# Patient Record
Sex: Male | Born: 1980 | Race: Black or African American | Hispanic: No | Marital: Single | Smoking: Former smoker
Health system: Southern US, Community
[De-identification: ages and names within clinical notes are randomized; demographics above are authoritative.]

## PROBLEM LIST (undated history)

## (undated) DIAGNOSIS — I10 Essential (primary) hypertension: Secondary | ICD-10-CM

## (undated) DIAGNOSIS — K469 Unspecified abdominal hernia without obstruction or gangrene: Secondary | ICD-10-CM

## (undated) HISTORY — DX: Unspecified abdominal hernia without obstruction or gangrene: K46.9

---
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
## 2003-03-26 ENCOUNTER — Emergency Department (HOSPITAL_COMMUNITY): Admission: EM | Admit: 2003-03-26 | Discharge: 2003-03-26 | Payer: Self-pay | Admitting: Emergency Medicine

## 2003-07-01 ENCOUNTER — Emergency Department (HOSPITAL_COMMUNITY): Admission: EM | Admit: 2003-07-01 | Discharge: 2003-07-01 | Payer: Self-pay

## 2004-01-06 ENCOUNTER — Emergency Department (HOSPITAL_COMMUNITY): Admission: EM | Admit: 2004-01-06 | Discharge: 2004-01-06 | Payer: Self-pay | Admitting: Emergency Medicine

## 2004-07-03 ENCOUNTER — Emergency Department (HOSPITAL_COMMUNITY): Admission: EM | Admit: 2004-07-03 | Discharge: 2004-07-03 | Payer: Self-pay | Admitting: Emergency Medicine

## 2005-01-30 ENCOUNTER — Emergency Department (HOSPITAL_COMMUNITY): Admission: EM | Admit: 2005-01-30 | Discharge: 2005-01-30 | Payer: Self-pay | Admitting: Family Medicine

## 2007-03-06 ENCOUNTER — Emergency Department (HOSPITAL_COMMUNITY): Admission: EM | Admit: 2007-03-06 | Discharge: 2007-03-06 | Payer: Self-pay | Admitting: Emergency Medicine

## 2007-05-21 ENCOUNTER — Emergency Department (HOSPITAL_COMMUNITY): Admission: EM | Admit: 2007-05-21 | Discharge: 2007-05-21 | Payer: Self-pay | Admitting: Emergency Medicine

## 2007-07-26 ENCOUNTER — Emergency Department (HOSPITAL_COMMUNITY): Admission: EM | Admit: 2007-07-26 | Discharge: 2007-07-26 | Payer: Self-pay | Admitting: Family Medicine

## 2010-04-23 ENCOUNTER — Emergency Department (HOSPITAL_COMMUNITY)
Admission: EM | Admit: 2010-04-23 | Discharge: 2010-04-23 | Payer: Self-pay | Source: Home / Self Care | Admitting: Emergency Medicine

## 2010-10-13 ENCOUNTER — Inpatient Hospital Stay (INDEPENDENT_AMBULATORY_CARE_PROVIDER_SITE_OTHER)
Admission: RE | Admit: 2010-10-13 | Discharge: 2010-10-13 | Disposition: A | Discharge status: Home / Self Care | Payer: Self-pay | Source: Ambulatory Visit | Attending: Family Medicine | Admitting: Family Medicine

## 2010-10-13 DIAGNOSIS — K4090 Unilateral inguinal hernia, without obstruction or gangrene, not specified as recurrent: Secondary | ICD-10-CM

## 2010-10-29 ENCOUNTER — Ambulatory Visit (INDEPENDENT_AMBULATORY_CARE_PROVIDER_SITE_OTHER): Payer: Self-pay | Admitting: Surgery

## 2010-10-29 ENCOUNTER — Encounter (INDEPENDENT_AMBULATORY_CARE_PROVIDER_SITE_OTHER): Payer: Self-pay | Admitting: Surgery

## 2010-10-29 DIAGNOSIS — K4090 Unilateral inguinal hernia, without obstruction or gangrene, not specified as recurrent: Secondary | ICD-10-CM

## 2010-10-29 NOTE — Patient Instructions (Signed)
You will be scheduled for repair of your right inguinal hernia.  Limit heavy lifting to less than 20 lbs. If possible.

## 2010-10-29 NOTE — Progress Notes (Signed)
Subjective:     Patient ID: Connor Holloway, male   DOB: July 26, 1980, 30 y.o.   MRN: [id number]    BP 132/84  Pulse 50  Temp 96.8 F (36 C)  Ht 6' (1.829 m)  Wt 168 lb 6.4 oz (76.386 kg)  BMI 22.84 kg/m2    HPI  The patient is here today do to a bulge in his right groin. It has been present for 2 months. It is causing mild to moderate discomfort. Lifting makes a bulge in his right groin larger and causes more pain. The pain is a dull burning pain. It is a 5/10. Is made better with rest. The patient is here today at the request of   Review of Systems  Constitutional: Negative.   HENT: Negative.   Eyes: Negative.   Respiratory: Negative.   Cardiovascular: Negative.   Gastrointestinal: Negative.   Genitourinary: Negative.   Musculoskeletal: Negative.   Neurological: Negative.   Hematological: Negative.   Psychiatric/Behavioral: Negative.        Objective:   Physical Exam  Constitutional: He appears well-developed and well-nourished.  HENT:  Head: Normocephalic and atraumatic.  Eyes: EOM are normal. Pupils are equal, round, and reactive to light.  Neck: Normal range of motion. Neck supple.  Cardiovascular: Normal rate, regular rhythm, normal heart sounds and intact distal pulses.  Exam reveals no gallop and no friction rub.   No murmur heard. Pulmonary/Chest: Effort normal and breath sounds normal.  Abdominal: Soft. Bowel sounds are normal.       RIH reducible.  Soft non tender.No LIH.       Assessment:   Right inguinal hernia reducible Plan:  The patient has a symptomatic right inguinal hernia. I have discussed options of repair with him today. I've discussed nonoperative options of treatment. I reviewed both laparoscopic and open right inguinal hernia repair.The risk of hernia repair include bleeding,  Infection,   Recurrence of the hernia,  Mesh use, chronic pain,  Organ injury,  Bowel injury,  Bladder injury,   nerve injury with numbness around the incision,  and  worsening of preexisting  medical problems.  The alternatives to surgery have been discussed as well..  Long term expectations of both operative and non operative treatments have been discussed.   The patient agrees to proceed.

## 2010-11-18 ENCOUNTER — Emergency Department (HOSPITAL_COMMUNITY)
Admission: EM | Admit: 2010-11-18 | Discharge: 2010-11-18 | Disposition: A | Discharge status: Home / Self Care | Payer: Self-pay | Attending: Emergency Medicine | Admitting: Emergency Medicine

## 2010-11-18 DIAGNOSIS — I10 Essential (primary) hypertension: Secondary | ICD-10-CM | POA: Insufficient documentation

## 2010-11-18 DIAGNOSIS — K4090 Unilateral inguinal hernia, without obstruction or gangrene, not specified as recurrent: Secondary | ICD-10-CM | POA: Insufficient documentation

## 2011-01-18 ENCOUNTER — Ambulatory Visit (INDEPENDENT_AMBULATORY_CARE_PROVIDER_SITE_OTHER): Payer: PRIVATE HEALTH INSURANCE | Admitting: Internal Medicine

## 2011-01-18 ENCOUNTER — Encounter: Payer: Self-pay | Admitting: Internal Medicine

## 2011-01-18 VITALS — BP 122/80 | HR 74 | Temp 98.1°F | Resp 16 | Ht 72.0 in | Wt 178.0 lb

## 2011-01-18 DIAGNOSIS — K4090 Unilateral inguinal hernia, without obstruction or gangrene, not specified as recurrent: Secondary | ICD-10-CM

## 2011-01-18 DIAGNOSIS — Z0000 Encounter for general adult medical examination without abnormal findings: Secondary | ICD-10-CM

## 2011-01-18 NOTE — Patient Instructions (Signed)
General surgical evaluation  Return here as needed

## 2011-01-18 NOTE — Progress Notes (Signed)
Subjective:    Patient ID: Connor Holloway, male    DOB: 22-Jun-1980, 30 y.o.   MRN: [id number]  HPI   30 year old  -year-old patient of who presents today to establish with our practice. He has had a symptomatic right inguinal hernia since the spring of this year. He has been out of work for  approximately one month. He was seen by local general surgery but apparently they do not take his insurance. He is unable to work due to his symptomatic hernia. His past medical history is unremarkable he is an occasional same day smoker but never buys his own tobacco products. Social history unremarkable he is single employed at [employer].  Rare smoker graduate of  [employer]  Family history noncontributory father is late 70s in good health mother is 17 he has 4 sisters and 2 brothers all apparently in good health    Review of Systems  Constitutional: Negative for fever, chills, activity change, appetite change and fatigue.  HENT: Negative for hearing loss, ear pain, congestion, rhinorrhea, sneezing, mouth sores, trouble swallowing, neck pain, neck stiffness, dental problem, voice change, sinus pressure and tinnitus.   Eyes: Negative for photophobia, pain, redness and visual disturbance.  Respiratory: Negative for apnea, cough, choking, chest tightness, shortness of breath and wheezing.   Cardiovascular: Negative for chest pain, palpitations and leg swelling.  Gastrointestinal: Negative for nausea, vomiting, abdominal pain, diarrhea, constipation, blood in stool, abdominal distention, anal bleeding and rectal pain.       Symptomatic right inguinal hernia aggravated by heavy lifting  Genitourinary: Negative for dysuria, urgency, frequency, hematuria, flank pain, decreased urine volume, discharge, penile swelling, scrotal swelling, difficulty urinating, genital sores and testicular pain.  Musculoskeletal: Negative for myalgias, back pain, joint swelling, arthralgias and gait problem.    Skin: Negative for color change, rash and wound.  Neurological: Negative for dizziness, tremors, seizures, syncope, facial asymmetry, speech difficulty, weakness, light-headedness, numbness and headaches.  Hematological: Negative for adenopathy. Does not bruise/bleed easily.  Psychiatric/Behavioral: Negative for suicidal ideas, hallucinations, behavioral problems, confusion, sleep disturbance, self-injury, dysphoric mood, decreased concentration and agitation. The patient is not nervous/anxious.        Objective:   Physical Exam  Constitutional: He appears well-developed and well-nourished.  HENT:  Head: Normocephalic and atraumatic.  Right Ear: External ear normal.  Left Ear: External ear normal.  Nose: Nose normal.  Mouth/Throat: Oropharynx is clear and moist.  Eyes: Conjunctivae and EOM are normal. Pupils are equal, round, and reactive to light. No scleral icterus.  Neck: Normal range of motion. Neck supple. No JVD present. No thyromegaly present.  Cardiovascular: Regular rhythm, normal heart sounds and intact distal pulses.  Exam reveals no gallop and no friction rub.   No murmur heard. Pulmonary/Chest: Effort normal and breath sounds normal. He exhibits no tenderness.  Abdominal: Soft. Bowel sounds are normal. He exhibits no distension and no mass. There is no tenderness.       Easily reducible moderate-sized right inguinal hernia  Genitourinary: Penis normal.       Uncircumcised  Musculoskeletal: Normal range of motion. He exhibits no edema and no tenderness.  Lymphadenopathy:    He has no cervical adenopathy.  Neurological: He is alert. He has normal reflexes. No cranial nerve deficit. Coordination normal.  Skin: Skin is warm and dry. No rash noted.  Psychiatric: He has a normal mood and affect. His behavior is normal.          Assessment & Plan:  Preventive health Symptomatic right angle hernia. We'll set up for general surgery evaluation with a practice that  accepts his insurance plan

## 2011-01-21 ENCOUNTER — Telehealth (INDEPENDENT_AMBULATORY_CARE_PROVIDER_SITE_OTHER): Payer: Self-pay | Admitting: Surgery

## 2011-02-08 LAB — CULTURE, ROUTINE-ABSCESS: Gram Stain: NONE SEEN

## 2011-03-08 ENCOUNTER — Telehealth: Payer: Self-pay | Admitting: Internal Medicine

## 2011-03-08 NOTE — Telephone Encounter (Signed)
Pt need work note with no restrictions to take to employment office. Please call pt when ready for pick up

## 2011-03-09 NOTE — Telephone Encounter (Signed)
Pt aware letter ready for pick up 

## 2011-03-09 NOTE — Telephone Encounter (Signed)
OK 

## 2013-05-21 ENCOUNTER — Encounter (HOSPITAL_COMMUNITY): Payer: Self-pay | Admitting: Emergency Medicine

## 2013-05-21 ENCOUNTER — Emergency Department (HOSPITAL_COMMUNITY): Payer: Self-pay

## 2013-05-21 ENCOUNTER — Emergency Department (INDEPENDENT_AMBULATORY_CARE_PROVIDER_SITE_OTHER)
Admission: EM | Admit: 2013-05-21 | Discharge: 2013-05-21 | Disposition: A | Discharge status: Home / Self Care | Payer: Self-pay | Source: Home / Self Care | Attending: Family Medicine | Admitting: Family Medicine

## 2013-05-21 ENCOUNTER — Inpatient Hospital Stay (HOSPITAL_COMMUNITY)
Admission: EM | Admit: 2013-05-21 | Discharge: 2013-05-24 | DRG: 348 | Disposition: A | Discharge status: Home / Self Care | Payer: Self-pay | Attending: Surgery | Admitting: Surgery

## 2013-05-21 DIAGNOSIS — K4030 Unilateral inguinal hernia, with obstruction, without gangrene, not specified as recurrent: Principal | ICD-10-CM | POA: Diagnosis present

## 2013-05-21 DIAGNOSIS — K4090 Unilateral inguinal hernia, without obstruction or gangrene, not specified as recurrent: Secondary | ICD-10-CM | POA: Diagnosis present

## 2013-05-21 DIAGNOSIS — L0291 Cutaneous abscess, unspecified: Secondary | ICD-10-CM

## 2013-05-21 DIAGNOSIS — Z23 Encounter for immunization: Secondary | ICD-10-CM

## 2013-05-21 DIAGNOSIS — A4902 Methicillin resistant Staphylococcus aureus infection, unspecified site: Secondary | ICD-10-CM | POA: Diagnosis present

## 2013-05-21 DIAGNOSIS — L0390 Cellulitis, unspecified: Secondary | ICD-10-CM

## 2013-05-21 DIAGNOSIS — K612 Anorectal abscess: Secondary | ICD-10-CM | POA: Diagnosis present

## 2013-05-21 DIAGNOSIS — F17200 Nicotine dependence, unspecified, uncomplicated: Secondary | ICD-10-CM | POA: Diagnosis present

## 2013-05-21 DIAGNOSIS — K611 Rectal abscess: Secondary | ICD-10-CM | POA: Diagnosis present

## 2013-05-21 LAB — BASIC METABOLIC PANEL
BUN: 15 mg/dL (ref 6–23)
CALCIUM: 10.4 mg/dL (ref 8.4–10.5)
CHLORIDE: 98 meq/L (ref 96–112)
CO2: 23 mEq/L (ref 19–32)
CREATININE: 1.13 mg/dL (ref 0.50–1.35)
GFR calc non Af Amer: 86 mL/min — ABNORMAL LOW (ref >90)
Glucose, Bld: 103 mg/dL — ABNORMAL HIGH (ref 70–99)
POTASSIUM: 4.3 meq/L (ref 3.7–5.3)
SODIUM: 138 meq/L (ref 137–147)

## 2013-05-21 LAB — CBC
HCT: 49.3 % (ref 39.0–52.0)
HEMOGLOBIN: 17.5 g/dL — AB (ref 13.0–17.0)
MCH: 32.7 pg (ref 26.0–34.0)
MCHC: 35.5 g/dL (ref 30.0–36.0)
MCV: 92.1 fL (ref 78.0–100.0)
PLATELETS: 165 10*3/uL (ref 150–400)
RBC: 5.35 MIL/uL (ref 4.22–5.81)
RDW: 13.1 % (ref 11.5–15.5)
WBC: 15.3 10*3/uL — ABNORMAL HIGH (ref 4.0–10.5)

## 2013-05-21 MED ORDER — MORPHINE SULFATE 4 MG/ML IJ SOLN
6.0000 mg | Freq: Once | INTRAMUSCULAR | Status: AC
  Administered 2013-05-21: 6 mg via INTRAVENOUS
  Filled 2013-05-21: qty 2

## 2013-05-21 MED ORDER — MORPHINE SULFATE 2 MG/ML IJ SOLN
2.0000 mg | INTRAMUSCULAR | Status: DC | PRN
  Administered 2013-05-21: 2 mg via INTRAVENOUS
  Filled 2013-05-21: qty 1

## 2013-05-21 MED ORDER — HYDROCODONE-ACETAMINOPHEN 10-325 MG PO TABS
0.5000 | ORAL_TABLET | ORAL | Status: DC | PRN
  Administered 2013-05-22 – 2013-05-23 (×5): 2 via ORAL
  Filled 2013-05-21 (×6): qty 2

## 2013-05-21 MED ORDER — HEPARIN SODIUM (PORCINE) 5000 UNIT/ML IJ SOLN
5000.0000 [IU] | Freq: Three times a day (TID) | INTRAMUSCULAR | Status: DC
  Administered 2013-05-21 – 2013-05-24 (×7): 5000 [IU] via SUBCUTANEOUS
  Filled 2013-05-21 (×12): qty 1

## 2013-05-21 MED ORDER — ONDANSETRON HCL 4 MG/2ML IJ SOLN: 4.0000 mg | Freq: Four times a day (QID) | INTRAMUSCULAR | Status: DC | PRN

## 2013-05-21 MED ORDER — SODIUM CHLORIDE 0.45 % IV SOLN
INTRAVENOUS | Status: DC
  Administered 2013-05-21 – 2013-05-22 (×2): via INTRAVENOUS

## 2013-05-21 MED ORDER — HYDROCODONE-ACETAMINOPHEN 5-325 MG PO TABS: 1.0000 | ORAL_TABLET | Freq: Four times a day (QID) | ORAL | Status: DC | PRN

## 2013-05-21 MED ORDER — DOCUSATE SODIUM 100 MG PO CAPS
100.0000 mg | ORAL_CAPSULE | Freq: Two times a day (BID) | ORAL | Status: DC
  Administered 2013-05-21 – 2013-05-23 (×4): 100 mg via ORAL
  Filled 2013-05-21 (×4): qty 1

## 2013-05-21 NOTE — ED Notes (Signed)
Surgery at bedside.

## 2013-05-21 NOTE — ED Notes (Signed)
Patient transported to Ultrasound 

## 2013-05-21 NOTE — ED Provider Notes (Signed)
Connor Holloway is a 33 y.o. male who presents to Urgent Care today for  1) right inguinal hernia. This is been present since 2012. It has been reducible up until recently. Currently it is not reducible and becoming more painful. He is having problems urinating because of the hernia. He was seen by general surgery back in 2012 but does not have health insurance he was unable to have surgery.  The pain is worse with straining and activity.   2) additionally patient has an abscess on his right buttock for the past 5 days. He's tried that back then warm compress which has not helped much. The pain is moderate. No other medications tried   Past Medical History  Diagnosis Date  . Hernia    History  Substance Use Topics  . Smoking status: Current Some Day Smoker    Types: Cigarettes  . Smokeless tobacco: Never Used  . Alcohol Use: 0.6 oz/week    1 Cans of beer per week   ROS as above Medications: No current facility-administered medications for this encounter.   No current outpatient prescriptions on file.    Exam:  BP 134/94  Pulse 128  Temp(Src) 97.7 F (36.5 C) (Oral)  Resp 16  SpO2 100% Gen: Well NAD HEENT: MMM Lungs: Normal work of breathing. CTABL Heart: RRR no MRG Abd: NABS, Soft. NT, ND Exts: Brisk capillary refill, warm and well perfused.  Skin: Small areas of fluctuance right buttocks due to gluteal cleft with surrounding induration and erythema. Tender to touch.  Groin: Large right scrotal mass. It appears to be fluctuant and nonreducible. He hernia versus hydrocele. Tender to touch.   INCISION AND DRAINAGE Performed by: Connor Holloway, Connor Holloway, S Consent: Verbal consent obtained. Risks and benefits: risks, benefits and alternatives were discussed Type: abscess  Body area: right buttock  Anesthesia: local infiltration  Incision was made with a scalpel.  Local anesthetic: lidocaine 2% with epinephrine  Anesthetic total: 4 ml  Complexity: simple Blunt dissection to  break up loculations  Drainage: purulent  Drainage amount: 1ml  Packing material: None  Patient tolerance: Patient tolerated the procedure well with no immediate complications.  Culture obtained    Assessment and Plan: 33 y.o. male with  1) right inguinal hernia. This is now painful and interfering with urination and her reducible. Plan to transfer to the emergency for evaluation and management.   2) abscess: Drained in the clinic today. We'll defer to antibiotic management with ER physician.  Would place patient on Septra because he has surrounding induration and may have cellulitis as well as abscess.   Discussed warning signs or symptoms. Please see discharge instructions. Patient expresses understanding.    Connor BongEvan S Khamille Beynon, MD 05/21/13 1023

## 2013-05-21 NOTE — ED Provider Notes (Signed)
CSN: [id number]     Arrival date & time 05/21/13  1028 History   First MD Initiated Contact with Patient 05/21/13 1146     Chief Complaint  Patient presents with  . Groin Pain    HPI  Patient presents with concerns of persistent/worsening pain in his right inguinal area. Pain has been present for greater than 1 year, the worse over the past week. There is associated scrotal edema, no penis pain, no dysuria, no hematuria, no fever, no chills, no vomiting, no diarrhea. Patient was actually seen in our affiliated urgent care Center for a cutaneous abscess that was treated.  He was subsequently referred here for further evaluation and management of his abdominal pain. He notes over the time his pain is become worse in spite of using OTC medication. The pain was initially improved with reduction of a hernia.  Over the past year the hernia has become irreducible.   Past Medical History  Diagnosis Date  . Hernia    History reviewed. No pertinent past surgical history. Family History  Problem Relation Age of Onset  . Hypertension Mother    History  Substance Use Topics  . Smoking status: Current Some Day Smoker    Types: Cigarettes  . Smokeless tobacco: Never Used  . Alcohol Use: 0.6 oz/week    1 Cans of beer per week    Review of Systems  Constitutional:       Per HPI, otherwise negative  HENT:       Per HPI, otherwise negative  Respiratory:       Per HPI, otherwise negative  Cardiovascular:       Per HPI, otherwise negative  Gastrointestinal: Negative for vomiting.  Endocrine:       Negative aside from HPI  Genitourinary:       Neg aside from HPI   Musculoskeletal:       Per HPI, otherwise negative  Skin: Negative.   Neurological: Negative for syncope.    Allergies  Review of patient's allergies indicates no known allergies.  Home Medications  No current outpatient prescriptions on file. BP 115/82  Pulse 95  Temp(Src) 97.8 F (36.6 C)  Resp 16  SpO2  98% Physical Exam  Nursing note and vitals reviewed. Constitutional: He is oriented to person, place, and time. He appears well-developed. No distress.  HENT:  Head: Normocephalic and atraumatic.  Eyes: Conjunctivae and EOM are normal.  Cardiovascular: Normal rate and regular rhythm.   Pulmonary/Chest: Effort normal. No stridor. No respiratory distress.  Abdominal: He exhibits no distension. There is tenderness in the right lower quadrant.    Genitourinary:     Musculoskeletal: He exhibits no edema.  Neurological: He is alert and oriented to person, place, and time.  Skin: Skin is warm and dry.  Psychiatric: He has a normal mood and affect.    ED Course  Procedures (including critical care time) Labs Review Labs Reviewed - No data to display Imaging Review US Scrotum  05/21/2013   CLINICAL DATA:  Groin pain.  History of hernia.  EXAM: SCROTAL ULTRASOUND  DOPPLER ULTRASOUND OF THE TESTICLES  TECHNIQUE: Complete ultrasound examination of the testicles, epididymis, and other scrotal structures was performed. Color and spectral Doppler ultrasound were also utilized to evaluate blood flow to the testicles.  COMPARISON:  None.  FINDINGS: Right testicle  Measurements: 3.9 x 3.0 x 2.6 cm. No mass or microlithiasis visualized.  Left testicle  Measurements: 4.6 x 2.5 x 2.6 cm. No mass or microlithiasis  visualized.  Right epididymis:  Normal in size and appearance.  Left epididymis:  Normal in size and appearance.  Hydrocele:  None visualized.  Varicocele:  None visualized.  Pulsed Doppler interrogation of both testes demonstrates low resistance arterial and venous waveforms bilaterally.  Fat was seen in the superior aspect of the right scrotum, compatible with provided history of right-sided hernia. No bowel was identified extending into the scrotum.  IMPRESSION: 1. Unremarkable appearance of the testes without evidence of mass or torsion. 2. Fat in the superior aspect of the right scrotum,  compatible with provided history of hernia. No evidence of bowel herniation.   Electronically Signed   By: Sebastian AcheAllen  Grady   On: 05/21/2013 12:04   [place] Art/ven Flow Abd Pelv Doppler  05/21/2013   CLINICAL DATA:  Groin pain.  History of hernia.  EXAM: SCROTAL ULTRASOUND  DOPPLER ULTRASOUND OF THE TESTICLES  TECHNIQUE: Complete ultrasound examination of the testicles, epididymis, and other scrotal structures was performed. Color and spectral Doppler ultrasound were also utilized to evaluate blood flow to the testicles.  COMPARISON:  None.  FINDINGS: Right testicle  Measurements: 3.9 x 3.0 x 2.6 cm. No mass or microlithiasis visualized.  Left testicle  Measurements: 4.6 x 2.5 x 2.6 cm. No mass or microlithiasis visualized.  Right epididymis:  Normal in size and appearance.  Left epididymis:  Normal in size and appearance.  Hydrocele:  None visualized.  Varicocele:  None visualized.  Pulsed Doppler interrogation of both testes demonstrates low resistance arterial and venous waveforms bilaterally.  Fat was seen in the superior aspect of the right scrotum, compatible with provided history of right-sided hernia. No bowel was identified extending into the scrotum.  IMPRESSION: 1. Unremarkable appearance of the testes without evidence of mass or torsion. 2. Fat in the superior aspect of the right scrotum, compatible with provided history of hernia. No evidence of bowel herniation.   Electronically Signed   By: Sebastian AcheAllen  Grady   On: 05/21/2013 12:04    EKG Interpretation   None          MDM   1. Inguinal hernia with irreducibility     Patient presents with an irreducible hernia, tachycardia, pain, I discussed this case with our surgical team who will assist with evaluation and disposition.   Gerhard Munchobert Elise Gladden, MD 05/22/13 [phone]

## 2013-05-21 NOTE — ED Notes (Signed)
Called ultrasound and confirmed patient is currently there and will be transported to POD A6 when completed.

## 2013-05-21 NOTE — H&P (Signed)
Connor Holloway is an 34 y.o. male.   Chief Complaint: RIH HPI: Connor Holloway presents to the ED with increased right groin pain stemming from a previously diagnosed RIH. He noted that it became irreducible sometime about a year ago and the pain increased though was still manageable. About a week ago he developed a right buttock abscess and about the same time his groin pain increased dramatically and has been building ever since. He had 1 e/o nausea last night but no emesis. Appetite is down. Denies problems with bowels but says it seems the hernia is making it difficult for him to start urinating.  Past Medical History  Diagnosis Date  . Hernia     History reviewed. No pertinent past surgical history.  Family History  Problem Relation Age of Onset  . Hypertension Mother    Social History:  reports that he has been smoking Cigarettes.  He has been smoking about 0.00 packs per day. He has never used smokeless tobacco. He reports that he drinks about 0.6 ounces of alcohol per week. He reports that he uses illicit drugs.  Allergies: No Known Allergies   Results for orders placed during the hospital encounter of 05/21/13 (from the past 48 hour(s))  CBC     Status: Abnormal   Collection Time    05/21/13  2:31 PM      Result Value Range   WBC 15.3 (*) 4.0 - 10.5 K/uL   RBC 5.35  4.22 - 5.81 MIL/uL   Hemoglobin 17.5 (*) 13.0 - 17.0 g/dL   HCT 96.0  45.4 - 09.8 %   MCV 92.1  78.0 - 100.0 fL   MCH 32.7  26.0 - 34.0 pg   MCHC 35.5  30.0 - 36.0 g/dL   RDW 11.9  14.7 - 82.9 %   Platelets 165  150 - 400 K/uL   US Scrotum  05/21/2013   CLINICAL DATA:  Groin pain.  History of hernia.  EXAM: SCROTAL ULTRASOUND  DOPPLER ULTRASOUND OF THE TESTICLES  TECHNIQUE: Complete ultrasound examination of the testicles, epididymis, and other scrotal structures was performed. Color and spectral Doppler ultrasound were also utilized to evaluate blood flow to the testicles.  COMPARISON:  None.  FINDINGS: Right  testicle  Measurements: 3.9 x 3.0 x 2.6 cm. No mass or microlithiasis visualized.  Left testicle  Measurements: 4.6 x 2.5 x 2.6 cm. No mass or microlithiasis visualized.  Right epididymis:  Normal in size and appearance.  Left epididymis:  Normal in size and appearance.  Hydrocele:  None visualized.  Varicocele:  None visualized.  Pulsed Doppler interrogation of both testes demonstrates low resistance arterial and venous waveforms bilaterally.  Fat was seen in the superior aspect of the right scrotum, compatible with provided history of right-sided hernia. No bowel was identified extending into the scrotum.  IMPRESSION: 1. Unremarkable appearance of the testes without evidence of mass or torsion. 2. Fat in the superior aspect of the right scrotum, compatible with provided history of hernia. No evidence of bowel herniation.   Electronically Signed   By: Sebastian Ache   On: 05/21/2013 12:04   [place] Art/ven Flow Abd Pelv Doppler  05/21/2013   CLINICAL DATA:  Groin pain.  History of hernia.  EXAM: SCROTAL ULTRASOUND  DOPPLER ULTRASOUND OF THE TESTICLES  TECHNIQUE: Complete ultrasound examination of the testicles, epididymis, and other scrotal structures was performed. Color and spectral Doppler ultrasound were also utilized to evaluate blood flow to the testicles.  COMPARISON:  None.  FINDINGS: Right testicle  Measurements: 3.9 x 3.0 x 2.6 cm. No mass or microlithiasis visualized.  Left testicle  Measurements: 4.6 x 2.5 x 2.6 cm. No mass or microlithiasis visualized.  Right epididymis:  Normal in size and appearance.  Left epididymis:  Normal in size and appearance.  Hydrocele:  None visualized.  Varicocele:  None visualized.  Pulsed Doppler interrogation of both testes demonstrates low resistance arterial and venous waveforms bilaterally.  Fat was seen in the superior aspect of the right scrotum, compatible with provided history of right-sided hernia. No bowel was identified extending into the scrotum.  IMPRESSION: 1.  Unremarkable appearance of the testes without evidence of mass or torsion. 2. Fat in the superior aspect of the right scrotum, compatible with provided history of hernia. No evidence of bowel herniation.   Electronically Signed   By: Sebastian AcheAllen  Grady   On: 05/21/2013 12:04    Review of Systems  Gastrointestinal: Positive for nausea and abdominal pain (Pelvic). Negative for vomiting, diarrhea and constipation.  Genitourinary: Positive for dysuria.  Skin:       Abscess right buttock  All other systems reviewed and are negative.    Blood pressure 135/80, pulse 76, temperature 97.8 F (36.6 C), resp. rate 16, SpO2 98.00%. Physical Exam  Constitutional: He is oriented to person, place, and time. He appears well-developed and well-nourished. No distress.  HENT:  Head: Normocephalic and atraumatic.  Nose: Nose normal.  Mouth/Throat: Oropharynx is clear and moist. No oropharyngeal exudate.  Eyes: Conjunctivae are normal. Right eye exhibits no discharge. Left eye exhibits no discharge. No scleral icterus.  Neck: Normal range of motion. Neck supple.  Cardiovascular: Normal rate, regular rhythm and normal heart sounds.  Exam reveals no gallop and no friction rub.   No murmur heard. Respiratory: Effort normal and breath sounds normal. No respiratory distress. He has no wheezes. He has no rales.  GI: Soft. Bowel sounds are normal. He exhibits no distension. There is no tenderness. There is no rebound and no guarding. A hernia is present. Hernia confirmed positive in the right inguinal area.  Genitourinary: Penis normal. Right testis shows mass and tenderness.  Musculoskeletal: Normal range of motion. He exhibits no edema and no tenderness.  Lymphadenopathy:    He has no cervical adenopathy.       Right: No inguinal adenopathy present.  Neurological: He is alert and oriented to person, place, and time.  Skin: Lesion (Right buttock) noted. He is not diaphoretic.  Psychiatric: He has a normal mood and  affect. His behavior is normal.     Assessment/Plan Incarcerated RIH -- Unable to reduce. [facility] Ad[facility] to [facility] with plans to repair tomorrow if able.    Freeman CaldronMichael J. Swathi Dauphin, PA-C Pager: [phone] 05/21/2013, 3:09 PM

## 2013-05-21 NOTE — ED Notes (Signed)
Pt was [facility] today for boil and was sent here for hernia that will not go back  In  Since 2012 has gotten bigger and he cannot reduce any more and it swells

## 2013-05-21 NOTE — H&P (Signed)
I have seen and examined the patient and agree with the assessment and plans. The patient has a right inguinal hernia which appears to have incarcerated omentum and not bowel. We'll plan to repair the hernia with mesh in the morning. I discussed the risks of surgery which includes but is not limited to bleeding, infection, chronic pain, nerve entrapment, injury to surrounding structures, use of mesh, postoperative recovery, recurrence, et Connor Holloway. He understands and wishes to proceed in the morning with  surgery  Connor Holloway A. Connor IvanBlackman  MD, FACS

## 2013-05-21 NOTE — ED Notes (Signed)
Clicked off Obtain Consent by mistake, patient still needs consent.

## 2013-05-21 NOTE — ED Notes (Signed)
C/o of boil on the right buttock x 5 days.  Having pain and swelling.  Denies drainage.  Pt has soaked and used fat back to bring to a head with no relief.    Also c/o having and inguinal hernia (right groin area) since 2012.  Pt is having pain and swelling/causing problems with urinating.

## 2013-05-22 ENCOUNTER — Encounter (HOSPITAL_COMMUNITY): Admission: EM | Disposition: A | Discharge status: Home / Self Care | Payer: Self-pay | Source: Home / Self Care

## 2013-05-22 ENCOUNTER — Observation Stay (HOSPITAL_COMMUNITY): Payer: Self-pay | Admitting: Anesthesiology

## 2013-05-22 ENCOUNTER — Encounter (HOSPITAL_COMMUNITY): Payer: Self-pay | Admitting: *Deleted

## 2013-05-22 ENCOUNTER — Encounter (HOSPITAL_COMMUNITY): Payer: Self-pay | Admitting: Anesthesiology

## 2013-05-22 DIAGNOSIS — L03317 Cellulitis of buttock: Secondary | ICD-10-CM

## 2013-05-22 DIAGNOSIS — K611 Rectal abscess: Secondary | ICD-10-CM | POA: Diagnosis present

## 2013-05-22 DIAGNOSIS — L0231 Cutaneous abscess of buttock: Secondary | ICD-10-CM

## 2013-05-22 HISTORY — PX: IRRIGATION AND DEBRIDEMENT ABSCESS: SHX5252

## 2013-05-22 HISTORY — PX: INGUINAL HERNIA REPAIR: SHX194

## 2013-05-22 LAB — SURGICAL PCR SCREEN
MRSA, PCR: POSITIVE — AB
Staphylococcus aureus: POSITIVE — AB

## 2013-05-22 SURGERY — REPAIR, HERNIA, INGUINAL, INCARCERATED
Anesthesia: General | Site: Groin | Laterality: Right

## 2013-05-22 MED ORDER — MUPIROCIN 2 % EX OINT
1.0000 "application " | TOPICAL_OINTMENT | Freq: Two times a day (BID) | CUTANEOUS | Status: DC
  Administered 2013-05-22 – 2013-05-23 (×3): 1 via NASAL
  Filled 2013-05-22: qty 22

## 2013-05-22 MED ORDER — PROPOFOL 10 MG/ML IV BOLUS
INTRAVENOUS | Status: AC
  Filled 2013-05-22: qty 20

## 2013-05-22 MED ORDER — INFLUENZA VAC SPLIT QUAD 0.5 ML IM SUSP
0.5000 mL | INTRAMUSCULAR | Status: AC
  Administered 2013-05-24: 0.5 mL via INTRAMUSCULAR
  Filled 2013-05-22: qty 0.5

## 2013-05-22 MED ORDER — METOCLOPRAMIDE HCL 5 MG/ML IJ SOLN
INTRAMUSCULAR | Status: DC | PRN
  Administered 2013-05-22: 10 mg via INTRAVENOUS

## 2013-05-22 MED ORDER — CHLORHEXIDINE GLUCONATE 0.12 % MT SOLN
15.0000 mL | Freq: Two times a day (BID) | OROMUCOSAL | Status: DC
  Administered 2013-05-22: 15 mL via OROMUCOSAL
  Filled 2013-05-22: qty 15

## 2013-05-22 MED ORDER — METOCLOPRAMIDE HCL 5 MG/ML IJ SOLN: 10.0000 mg | Freq: Once | INTRAMUSCULAR | Status: DC | PRN

## 2013-05-22 MED ORDER — MIDAZOLAM HCL 5 MG/5ML IJ SOLN
INTRAMUSCULAR | Status: DC | PRN
  Administered 2013-05-22: 2 mg via INTRAVENOUS

## 2013-05-22 MED ORDER — OXYCODONE HCL 5 MG PO TABS: 5.0000 mg | ORAL_TABLET | Freq: Once | ORAL | Status: DC | PRN

## 2013-05-22 MED ORDER — ONDANSETRON HCL 4 MG/2ML IJ SOLN
INTRAMUSCULAR | Status: DC | PRN
  Administered 2013-05-22: 4 mg via INTRAVENOUS

## 2013-05-22 MED ORDER — 0.9 % SODIUM CHLORIDE (POUR BTL) OPTIME
TOPICAL | Status: DC | PRN
  Administered 2013-05-22: 1000 mL

## 2013-05-22 MED ORDER — ONDANSETRON HCL 4 MG/2ML IJ SOLN
INTRAMUSCULAR | Status: AC
  Filled 2013-05-22: qty 2

## 2013-05-22 MED ORDER — KETOROLAC TROMETHAMINE 30 MG/ML IJ SOLN
INTRAMUSCULAR | Status: DC | PRN
  Administered 2013-05-22: 30 mg via INTRAVENOUS

## 2013-05-22 MED ORDER — PROPOFOL 10 MG/ML IV BOLUS
INTRAVENOUS | Status: DC | PRN
  Administered 2013-05-22: 200 mg via INTRAVENOUS

## 2013-05-22 MED ORDER — LIDOCAINE HCL (CARDIAC) 20 MG/ML IV SOLN
INTRAVENOUS | Status: DC | PRN
  Administered 2013-05-22: 100 mg via INTRAVENOUS

## 2013-05-22 MED ORDER — PIPERACILLIN-TAZOBACTAM 3.375 G IVPB 30 MIN
3.3750 g | Freq: Once | INTRAVENOUS | Status: DC
  Filled 2013-05-22: qty 50

## 2013-05-22 MED ORDER — HYDROMORPHONE HCL PF 1 MG/ML IJ SOLN
INTRAMUSCULAR | Status: AC
  Filled 2013-05-22: qty 1

## 2013-05-22 MED ORDER — ONDANSETRON HCL 4 MG/2ML IJ SOLN: 4.0000 mg | Freq: Four times a day (QID) | INTRAMUSCULAR | Status: DC | PRN

## 2013-05-22 MED ORDER — FENTANYL CITRATE 0.05 MG/ML IJ SOLN
INTRAMUSCULAR | Status: DC | PRN
  Administered 2013-05-22 (×3): 50 ug via INTRAVENOUS

## 2013-05-22 MED ORDER — MIDAZOLAM HCL 2 MG/2ML IJ SOLN
INTRAMUSCULAR | Status: AC
  Filled 2013-05-22: qty 2

## 2013-05-22 MED ORDER — OXYCODONE HCL 5 MG/5ML PO SOLN: 5.0000 mg | Freq: Once | ORAL | Status: DC | PRN

## 2013-05-22 MED ORDER — PIPERACILLIN-TAZOBACTAM 3.375 G IVPB
3.3750 g | Freq: Three times a day (TID) | INTRAVENOUS | Status: DC
  Administered 2013-05-22 – 2013-05-24 (×6): 3.375 g via INTRAVENOUS
  Filled 2013-05-22 (×8): qty 50

## 2013-05-22 MED ORDER — HYDROMORPHONE HCL PF 1 MG/ML IJ SOLN
0.2500 mg | INTRAMUSCULAR | Status: DC | PRN
  Administered 2013-05-22: 0.5 mg via INTRAVENOUS

## 2013-05-22 MED ORDER — BIOTENE DRY MOUTH MT LIQD
15.0000 mL | Freq: Two times a day (BID) | OROMUCOSAL | Status: DC
  Administered 2013-05-22: 15 mL via OROMUCOSAL

## 2013-05-22 MED ORDER — CHLORHEXIDINE GLUCONATE CLOTH 2 % EX PADS
6.0000 | MEDICATED_PAD | Freq: Every day | CUTANEOUS | Status: DC
  Administered 2013-05-22 – 2013-05-24 (×3): 6 via TOPICAL

## 2013-05-22 MED ORDER — BUPIVACAINE-EPINEPHRINE 0.5% -1:200000 IJ SOLN
INTRAMUSCULAR | Status: DC | PRN
  Administered 2013-05-22: 30 mL

## 2013-05-22 MED ORDER — KETOROLAC TROMETHAMINE 30 MG/ML IJ SOLN
INTRAMUSCULAR | Status: AC
  Filled 2013-05-22: qty 1

## 2013-05-22 MED ORDER — METOCLOPRAMIDE HCL 5 MG/ML IJ SOLN
INTRAMUSCULAR | Status: AC
  Filled 2013-05-22: qty 2

## 2013-05-22 MED ORDER — LACTATED RINGERS IV SOLN
INTRAVENOUS | Status: DC
  Administered 2013-05-22: 09:00:00 via INTRAVENOUS

## 2013-05-22 MED ORDER — FENTANYL CITRATE 0.05 MG/ML IJ SOLN
INTRAMUSCULAR | Status: AC
  Filled 2013-05-22: qty 5

## 2013-05-22 SURGICAL SUPPLY — 45 items
APL SKNCLS STERI-STRIP NONHPOA (GAUZE/BANDAGES/DRESSINGS) ×2
BENZOIN TINCTURE PRP APPL 2/3 (GAUZE/BANDAGES/DRESSINGS) ×4 IMPLANT
BLADE SURG 10 STRL SS (BLADE) ×4 IMPLANT
BLADE SURG 15 STRL LF DISP TIS (BLADE) ×2 IMPLANT
BLADE SURG 15 STRL SS (BLADE) ×4
BLADE SURG ROTATE 9660 (MISCELLANEOUS) ×2 IMPLANT
CHLORAPREP W/TINT 26ML (MISCELLANEOUS) ×4 IMPLANT
CLOSURE WOUND 1/2 X4 (GAUZE/BANDAGES/DRESSINGS) ×1
COVER SURGICAL LIGHT HANDLE (MISCELLANEOUS) ×4 IMPLANT
DRAIN PENROSE 1/2X12 LTX STRL (WOUND CARE) ×2 IMPLANT
DRAPE LAPAROTOMY TRNSV 102X78 (DRAPE) ×4 IMPLANT
DRESSING TELFA 8X3 (GAUZE/BANDAGES/DRESSINGS) ×4 IMPLANT
DRSG TEGADERM 4X4.75 (GAUZE/BANDAGES/DRESSINGS) ×4 IMPLANT
ELECT CAUTERY BLADE 6.4 (BLADE) ×4 IMPLANT
ELECT REM PT RETURN 9FT ADLT (ELECTROSURGICAL) ×4
ELECTRODE REM PT RTRN 9FT ADLT (ELECTROSURGICAL) ×2 IMPLANT
GLOVE BIO SURGEON STRL SZ7.5 (GLOVE) ×2 IMPLANT
GLOVE BIOGEL PI IND STRL 7.0 (GLOVE) IMPLANT
GLOVE BIOGEL PI INDICATOR 7.0 (GLOVE) ×2
GLOVE SURG SIGNA 7.5 PF LTX (GLOVE) ×4 IMPLANT
GLOVE SURG SS PI 7.0 STRL IVOR (GLOVE) ×2 IMPLANT
GOWN STRL NON-REIN LRG LVL3 (GOWN DISPOSABLE) ×4 IMPLANT
GOWN STRL REIN XL XLG (GOWN DISPOSABLE) ×4 IMPLANT
KIT BASIN OR (CUSTOM PROCEDURE TRAY) ×4 IMPLANT
KIT ROOM TURNOVER OR (KITS) ×4 IMPLANT
MESH PARIETEX PROGRIP RIGHT (Mesh General) ×2 IMPLANT
NDL HYPO 25GX1X1/2 BEV (NEEDLE) ×2 IMPLANT
NEEDLE HYPO 25GX1X1/2 BEV (NEEDLE) ×4 IMPLANT
NS IRRIG 1000ML POUR BTL (IV SOLUTION) ×4 IMPLANT
PACK SURGICAL SETUP 50X90 (CUSTOM PROCEDURE TRAY) ×4 IMPLANT
PAD ARMBOARD 7.5X6 YLW CONV (MISCELLANEOUS) ×8 IMPLANT
PENCIL BUTTON HOLSTER BLD 10FT (ELECTRODE) ×4 IMPLANT
SPECIMEN JAR SMALL (MISCELLANEOUS) IMPLANT
SPONGE GAUZE 4X4 12PLY (GAUZE/BANDAGES/DRESSINGS) ×4 IMPLANT
SPONGE LAP 18X18 X RAY DECT (DISPOSABLE) ×4 IMPLANT
STRIP CLOSURE SKIN 1/2X4 (GAUZE/BANDAGES/DRESSINGS) ×3 IMPLANT
SUT MON AB 4-0 PC3 18 (SUTURE) ×4 IMPLANT
SUT SILK 2 0 SH (SUTURE) ×2 IMPLANT
SUT VIC AB 2-0 CT1 27 (SUTURE) ×8
SUT VIC AB 2-0 CT1 TAPERPNT 27 (SUTURE) ×4 IMPLANT
SUT VIC AB 3-0 CT1 27 (SUTURE) ×4
SUT VIC AB 3-0 CT1 TAPERPNT 27 (SUTURE) ×2 IMPLANT
SYR CONTROL 10ML LL (SYRINGE) ×4 IMPLANT
TOWEL OR 17X24 6PK STRL BLUE (TOWEL DISPOSABLE) ×4 IMPLANT
TOWEL OR 17X26 10 PK STRL BLUE (TOWEL DISPOSABLE) ×4 IMPLANT

## 2013-05-22 NOTE — Interval H&P Note (Signed)
History and Physical Interval Note:  He also has a right buttock abscess that was not adequately drained in the ER yesterday so will also I and D the right buttock abscess.  Risks of this procedure were discussed also.  05/22/2013 9:08 AM  Connor Holloway  has presented today for surgery, with the diagnosis of Inc. Right Ing. Hernia  The various methods of treatment have been discussed with the patient and family. After consideration of risks, benefits and other options for treatment, the patient has consented to  Procedure(s): HERNIA REPAIR INGUINAL INCARCERATED WITH MESH (Right) as a surgical intervention .  The patient's history has been reviewed, patient examined, no change in status, stable for surgery.  I have reviewed the patient's chart and labs.  Questions were answered to the patient's satisfaction.     Bernd Crom A

## 2013-05-22 NOTE — Op Note (Signed)
HERNIA REPAIR INGUINAL INCARCERATED WITH MESH, IRRIGATION AND DEBRIDEMENT  BUTTOCK ABSCESS  Procedure Note  Orlan Leavensnthony T Verstraete 05/21/2013 - 05/22/2013   Pre-op Diagnosis: Inc. Right Ing. Hernia Buttock abcess     Post-op Diagnosis: same  Procedure(s): RIGHT HERNIA REPAIR INGUINAL INCARCERATED WITH MESH IRRIGATION AND DEBRIDEMENT  RIGHT BUTTOCK ABSCESS  Surgeon(s): Shelly Rubensteinouglas A Miriah Maruyama, MD  Anesthesia: General  Staff:  Circulator: Alpha GulaKathy Somers, RN Scrub Person: Leighton ParodyAnn Marie Wilson, CST Circulator Assistant: Lina SayreEmily E Ellis, RN RN First Assistant: Maureen RalphsSheila C Bowman, RN  Estimated Blood Loss: Minimal               Specimens:           Abigail MiyamotoBLACKMAN,Merl Bommarito A   Date: 05/22/2013  Time: 10:17 AM

## 2013-05-22 NOTE — Anesthesia Postprocedure Evaluation (Signed)
Anesthesia Post Note  Patient: Connor Holloway  Procedure(s) Performed: Procedure(s) (LRB): HERNIA REPAIR INGUINAL INCARCERATED WITH MESH (Right) IRRIGATION AND DEBRIDEMENT  BUTTOCK ABSCESS  Anesthesia type: General  Patient location: PACU  Post pain: Pain level controlled  Post assessment: Patient's Cardiovascular Status Stable  Last Vitals:  Filed Vitals:   05/22/13 1045  BP: 136/75  Pulse: 77  Temp:   Resp: 22    Post vital signs: Reviewed and stable  Level of consciousness: alert  Complications: No apparent anesthesia complications

## 2013-05-22 NOTE — Op Note (Signed)
[name], [name] NO.:  [id number]  MEDICAL RECORD NO.:  [id number]  LOCATION:  6N30C                        FACILITY:  [facility]  PHYSICIAN:  Abigail Miyamoto, M.D. DATE OF BIRTH:  11/09/1980  DATE OF PROCEDURE:  05/22/2013 DATE OF DISCHARGE:                              OPERATIVE REPORT   PREOPERATIVE DIAGNOSES: 1. Incarcerated right inguinal hernia. 2. Right buttock abscess.  POSTOPERATIVE DIAGNOSES: 1. Incarcerated right inguinal hernia. 2. Right buttock abscess.  PROCEDURE: 1. Repair of incarcerated right inguinal hernia with mesh. 2. Incision and drainage of right buttock abscess.  SURGEON:  Abigail Miyamoto, MD  ANESTHESIA:  General and 0.5% Marcaine.  ESTIMATED BLOOD LOSS:  Minimal.  PROCEDURE IN DETAIL:  The patient was brought to the operating room, identified as Connor Holloway.  He was placed supine on the operating table and general anesthesia was induced.  His abdomen was then prepped and draped in usual sterile fashion.  I performed a right ilioinguinal nerve block with Marcaine.  I anesthetized the skin with Marcaine as well in the right groin.  I then made a longitudinal incision with a scalpel.  I took this down through Scarpa fascia with electrocautery. The external oblique fascia then identified and opened through the internal and external rings.  The enlarged testicular cord structures were controlled with Penrose drain.  I was able to separate the thickened sac from the cord structures.  I opened the sac and pulled all of the omentum out of the scrotum and reduced it back into the abdominal cavity.  No bowel was involved in the hernia.  I then dissected the sac off completely.  I tied off the base with 2-0 silk suture and then excised the redundant sac.  Next, a piece of Parietex ProGrip Prolene mesh was brought into the field.  I placed it as an onlay on the inguinal floor and brought around the cord structures.  I sewed the  mesh to the pubic tubercle and the shelving edge of the inguinal ligament with interrupted 2-0 Vicryl sutures.  Good coverage of the inguinal floor appeared to be achieved.  I then removed the Penrose drain.  I closed the external oblique fascia over top with a running 2-0 Vicryl suture.  I anesthetized the fascia and skin further with Marcaine.  I then closed the Scarpa fascia with interrupted 3-0 Vicryl sutures and closed the skin with a running 4-0 Monocryl.  Steri-Strips, Telfa, and Tegaderm were then applied.  Next, the patient was frog-legged.  I prepped the area of the abscess which had had a previous incision with Betadine.  I then opened this up further after anesthetizing with Marcaine with a scalpel.  I identified a large abscess cavity which was drained.  I then packed this with wet-to-dry 4 x 4.  Dry gauze and tape were then placed over this.  The patient tolerated the procedure well.  All the counts were correct at the end of the procedure.  The patient was then extubated in the operating room and taken in stable condition to the recovery room.     Abigail Miyamoto, M.D.     DB/MEDQ  D:  05/22/2013  T:  05/22/2013  Job:  [id number]

## 2013-05-22 NOTE — Anesthesia Procedure Notes (Signed)
Procedure Name: LMA Insertion Date/Time: 05/22/2013 9:35 AM Performed by: Arlice ColtMANESS, Jacquelinne Speak B Pre-anesthesia Checklist: Patient identified, Emergency Drugs available, Suction available, Patient being monitored and Timeout performed Patient Re-evaluated:Patient Re-evaluated prior to inductionOxygen Delivery Method: Circle system utilized Preoxygenation: Pre-oxygenation with 100% oxygen Intubation Type: IV induction LMA: LMA inserted LMA Size: 4.0 Number of attempts: 1 Placement Confirmation: positive ETCO2 and breath sounds checked- equal and bilateral Tube secured with: Tape Dental Injury: Teeth and Oropharynx as per pre-operative assessment

## 2013-05-22 NOTE — Anesthesia Preprocedure Evaluation (Signed)
Anesthesia Evaluation  Patient identified by MRN, date of birth, ID band Patient awake    Reviewed: Allergy & Precautions, H&P , NPO status , Patient's Chart, lab work & pertinent test results, reviewed documented beta blocker date and time   Airway Mallampati: II TM Distance: >3 FB Neck ROM: full    Dental   Pulmonary Current Smoker,  breath sounds clear to auscultation        Cardiovascular negative cardio ROS  Rhythm:regular     Neuro/Psych negative neurological ROS  negative psych ROS   GI/Hepatic negative GI ROS, Neg liver ROS,   Endo/Other  negative endocrine ROS  Renal/GU negative Renal ROS  negative genitourinary   Musculoskeletal   Abdominal   Peds  Hematology negative hematology ROS (+)   Anesthesia Other Findings See surgeon's H&P   Reproductive/Obstetrics negative OB ROS                           Anesthesia Physical Anesthesia Plan  ASA: II  Anesthesia Plan: General   Post-op Pain Management:    Induction: Intravenous  Airway Management Planned: LMA  Additional Equipment:   Intra-op Plan:   Post-operative Plan:   Informed Consent: I have reviewed the patients History and Physical, chart, labs and discussed the procedure including the risks, benefits and alternatives for the proposed anesthesia with the patient or authorized representative who has indicated his/her understanding and acceptance.   Dental Advisory Given  Plan Discussed with: CRNA and Surgeon  Anesthesia Plan Comments:         Anesthesia Quick Evaluation  

## 2013-05-22 NOTE — Progress Notes (Signed)
Patient ID: Connor Holloway, male   DOB: 08-Nov-1980, 33 y.o.   MRN: [id number]  Subjective: Complains of buttock pain, drainage x1 week.  Denies fever or chills.   Objective:  Vital signs:  Filed Vitals:   05/21/13 1734 05/21/13 1800 05/21/13 2114 05/22/13 0642  BP: 134/82 139/76 136/81 127/72  Pulse: 76 75 103 81  Temp: 98.1 F (36.7 C)  98.5 F (36.9 C) 99.4 F (37.4 C)  TempSrc: Oral  Oral Oral  Resp: 20 16 18 17   Height:   6' 1"  (1.854 m)   Weight:   167 lb 12.8 oz (76.114 kg)   SpO2: 98% 98% 98% 98%    Last BM Date: 05/21/13  Intake/Output   Yesterday:  01/20 0701 - 01/21 0700 In: 600 [I.V.:600] Out: 300 [Urine:300] This shift:  Physical Exam:  General: Pt awake/alert/oriented x3 an in no acute distress Chest: CTA. No chest wall pain w good excursion CV:  Pulses intact.  Regular rhythm Abdomen: Soft.  Nondistended.  No evidence of peritonitis.  Right inguinal hernia noted.. Ext:  SCDs BLE.  No mjr edema.  No cyanosis Skin: No petechiae / purpura.  Approximately 4x4cm area of induration that is tender to palpation with purulent drainage.   Problem List:   Active Problems:   Right inguinal hernia   Results:   Labs: Results for orders placed during the hospital encounter of 05/21/13 (from the past 48 hour(s))  CBC     Status: Abnormal   Collection Time    05/21/13  2:31 PM      Result Value Range   WBC 15.3 (*) 4.0 - 10.5 K/uL   RBC 5.35  4.22 - 5.81 MIL/uL   Hemoglobin 17.5 (*) 13.0 - 17.0 g/dL   HCT 49.3  39.0 - 52.0 %   MCV 92.1  78.0 - 100.0 fL   MCH 32.7  26.0 - 34.0 pg   MCHC 35.5  30.0 - 36.0 g/dL   RDW 13.1  11.5 - 15.5 %   Platelets 165  150 - 400 K/uL  BASIC METABOLIC PANEL     Status: Abnormal   Collection Time    05/21/13  2:31 PM      Result Value Range   Sodium 138  137 - 147 mEq/L   Potassium 4.3  3.7 - 5.3 mEq/L   Chloride 98  96 - 112 mEq/L   CO2 23  19 - 32 mEq/L   Glucose, Bld 103 (*) 70 - 99 mg/dL   BUN 15  6 - 23 mg/dL    Creatinine, Ser 1.13  0.50 - 1.35 mg/dL   Calcium 10.4  8.4 - 10.5 mg/dL   GFR calc non Af Amer 86 (*) >90 mL/min   GFR calc Af Amer >90  >90 mL/min   Comment: (NOTE)     The eGFR has been calculated using the CKD EPI equation.     This calculation has not been validated in all clinical situations.     eGFR's persistently <90 mL/min signify possible Chronic Kidney     Disease.  SURGICAL PCR SCREEN     Status: Abnormal   Collection Time    05/21/13 10:39 PM      Result Value Range   MRSA, PCR POSITIVE (*) NEGATIVE   Comment: RESULT CALLED TO, READ BACK BY AND VERIFIED WITH:     Judieth Keens (RN) 0201 05/22/2013 L. [facility]   Staphylococcus aureus POSITIVE (*) NEGATIVE   Comment:  The Xpert SA Assay (FDA     approved for NASAL specimens     in patients over 28 years of age),     is one component of     a comprehensive surveillance     program.  Test performance has     been validated by [employer] for patients greater     than or equal to 64 year old.     It is not intended     to diagnose infection nor to     guide or monitor treatment.     RESULT CALLED TO, READ BACK BY AND VERIFIED WITH:     Judieth Keens (RN) 0201 05/22/2013 L. [facility]    Imaging / Studies: US Scrotum  05/21/2013   CLINICAL DATA:  Groin pain.  History of hernia.  EXAM: SCROTAL ULTRASOUND  DOPPLER ULTRASOUND OF THE TESTICLES  TECHNIQUE: Complete ultrasound examination of the testicles, epididymis, and other scrotal structures was performed. Color and spectral Doppler ultrasound were also utilized to evaluate blood flow to the testicles.  COMPARISON:  None.  FINDINGS: Right testicle  Measurements: 3.9 x 3.0 x 2.6 cm. No mass or microlithiasis visualized.  Left testicle  Measurements: 4.6 x 2.5 x 2.6 cm. No mass or microlithiasis visualized.  Right epididymis:  Normal in size and appearance.  Left epididymis:  Normal in size and appearance.  Hydrocele:  None visualized.  Varicocele:  None visualized.  Pulsed  Doppler interrogation of both testes demonstrates low resistance arterial and venous waveforms bilaterally.  Fat was seen in the superior aspect of the right scrotum, compatible with provided history of right-sided hernia. No bowel was identified extending into the scrotum.  IMPRESSION: 1. Unremarkable appearance of the testes without evidence of mass or torsion. 2. Fat in the superior aspect of the right scrotum, compatible with provided history of hernia. No evidence of bowel herniation.   Electronically Signed   By: Logan Bores   On: 05/21/2013 12:04   [place] Art/ven Flow Abd Pelv Doppler  05/21/2013   CLINICAL DATA:  Groin pain.  History of hernia.  EXAM: SCROTAL ULTRASOUND  DOPPLER ULTRASOUND OF THE TESTICLES  TECHNIQUE: Complete ultrasound examination of the testicles, epididymis, and other scrotal structures was performed. Color and spectral Doppler ultrasound were also utilized to evaluate blood flow to the testicles.  COMPARISON:  None.  FINDINGS: Right testicle  Measurements: 3.9 x 3.0 x 2.6 cm. No mass or microlithiasis visualized.  Left testicle  Measurements: 4.6 x 2.5 x 2.6 cm. No mass or microlithiasis visualized.  Right epididymis:  Normal in size and appearance.  Left epididymis:  Normal in size and appearance.  Hydrocele:  None visualized.  Varicocele:  None visualized.  Pulsed Doppler interrogation of both testes demonstrates low resistance arterial and venous waveforms bilaterally.  Fat was seen in the superior aspect of the right scrotum, compatible with provided history of right-sided hernia. No bowel was identified extending into the scrotum.  IMPRESSION: 1. Unremarkable appearance of the testes without evidence of mass or torsion. 2. Fat in the superior aspect of the right scrotum, compatible with provided history of hernia. No evidence of bowel herniation.   Electronically Signed   By: Logan Bores   On: 05/21/2013 12:04    Medications / Allergies: per  chart  Antibiotics: Anti-infectives   None      Assessment/Plan Incarcerated right inguinal hernia -to OR this AM for repair -consent signed and placed in the chart -SCDs,  heparin for VTE prophylaxis -pain control Right perirectal abscess -start zosyn -I&D may need to be repeated to allow for adequate drainage.  Unable to pack wound given small incision, apply dry dressing to area bid.  Will discuss with Dr. Rhae Lerner, [facility] Pager [phone] Office [phone]  05/22/2013 8:06 AM

## 2013-05-22 NOTE — Transfer of Care (Signed)
Immediate Anesthesia Transfer of Care Note  Patient: Connor Holloway  Procedure(s) Performed: Procedure(s): HERNIA REPAIR INGUINAL INCARCERATED WITH MESH (Right) IRRIGATION AND DEBRIDEMENT  BUTTOCK ABSCESS  Patient Location: PACU  Anesthesia Type:General  Level of Consciousness: alert  and oriented  Airway & Oxygen Therapy: Patient Spontanous Breathing  Post-op Assessment: Report given to PACU RN and Post -op Vital signs reviewed and stable  Post vital signs: Reviewed and stable  Complications: No apparent anesthesia complications

## 2013-05-22 NOTE — Progress Notes (Signed)
UR completed. Patient changed to inpatient- requiring IV antibiotics and IVF 

## 2013-05-23 LAB — CULTURE, ROUTINE-ABSCESS: GRAM STAIN: NONE SEEN

## 2013-05-23 MED ORDER — SODIUM CHLORIDE 0.9 % IJ SOLN: 3.0000 mL | Freq: Two times a day (BID) | INTRAMUSCULAR | Status: DC

## 2013-05-23 MED ORDER — SODIUM CHLORIDE 0.9 % IJ SOLN: 3.0000 mL | INTRAMUSCULAR | Status: DC | PRN

## 2013-05-23 MED ORDER — IBUPROFEN 800 MG PO TABS
800.0000 mg | ORAL_TABLET | Freq: Four times a day (QID) | ORAL | Status: DC
  Administered 2013-05-23 (×4): 800 mg via ORAL
  Filled 2013-05-23 (×8): qty 1

## 2013-05-23 MED ORDER — OXYCODONE-ACETAMINOPHEN 5-325 MG PO TABS
1.0000 | ORAL_TABLET | ORAL | Status: DC | PRN
  Administered 2013-05-23 – 2013-05-24 (×5): 2 via ORAL
  Filled 2013-05-23 (×5): qty 2

## 2013-05-23 MED ORDER — SODIUM CHLORIDE 0.9 % IV SOLN: 250.0000 mL | INTRAVENOUS | Status: DC | PRN

## 2013-05-23 NOTE — Progress Notes (Signed)
I have seen and examined the patient and agree with the assessment and plans.  Willmar Stockinger A. Caddie Randle  MD, FACS  

## 2013-05-23 NOTE — Progress Notes (Signed)
1 Day Post-Op  Subjective: C/o pain, passing flatus, tolerating diet, ambulating. Low grade temp  Objective: Vital signs in last 24 hours: Temp:  [97 F (36.1 C)-100.6 F (38.1 C)] 99.1 F (37.3 C) (01/22 0550) Pulse Rate:  [76-100] 93 (01/22 0550) Resp:  [14-22] 18 (01/22 0550) BP: (117-144)/(57-89) 125/57 mmHg (01/22 0550) SpO2:  [98 %-100 %] 99 % (01/22 0550) Last BM Date: 05/21/13  Intake/Output from previous day: 01/21 0701 - 01/22 0700 In: 1786.3 [I.V.:1786.3] Out: 550 [Urine:550] Intake/Output this shift:   PE General appearance: alert, cooperative and no distress Resp: clear to auscultation bilaterally Cardio: regular rate and rhythm, S1, S2 normal, no murmur, click, rub or gallop GI: soft, non-tender; bowel sounds normal; no masses,  no organomegaly RLQ dressing is c/d/i  Lab Results:   Recent Labs  05/21/13 1431  WBC 15.3*  HGB 17.5*  HCT 49.3  PLT 165   BMET  Recent Labs  05/21/13 1431  NA 138  K 4.3  CL 98  CO2 23  GLUCOSE 103*  BUN 15  CREATININE 1.13  CALCIUM 10.4   PT/INR No results found for this basename: LABPROT, INR,  in the last 72 hours ABG No results found for this basename: PHART, PCO2, PO2, HCO3,  in the last 72 hours  Studies/Results: [place] Scrotum  05/21/2013   CLINICAL DATA:  Groin pain.  History of hernia.  EXAM: SCROTAL ULTRASOUND  DOPPLER ULTRASOUND OF THE TESTICLES  TECHNIQUE: Complete ultrasound examination of the testicles, epididymis, and other scrotal structures was performed. Color and spectral Doppler ultrasound were also utilized to evaluate blood flow to the testicles.  COMPARISON:  None.  FINDINGS: Right testicle  Measurements: 3.9 x 3.0 x 2.6 cm. No mass or microlithiasis visualized.  Left testicle  Measurements: 4.6 x 2.5 x 2.6 cm. No mass or microlithiasis visualized.  Right epididymis:  Normal in size and appearance.  Left epididymis:  Normal in size and appearance.  Hydrocele:  None visualized.  Varicocele:  None  visualized.  Pulsed Doppler interrogation of both testes demonstrates low resistance arterial and venous waveforms bilaterally.  Fat was seen in the superior aspect of the right scrotum, compatible with provided history of right-sided hernia. No bowel was identified extending into the scrotum.  IMPRESSION: 1. Unremarkable appearance of the testes without evidence of mass or torsion. 2. Fat in the superior aspect of the right scrotum, compatible with provided history of hernia. No evidence of bowel herniation.   Electronically Signed   By: Sebastian AcheAllen  Grady   On: 05/21/2013 12:04   [place] Art/ven Flow Abd Pelv Doppler  05/21/2013   CLINICAL DATA:  Groin pain.  History of hernia.  EXAM: SCROTAL ULTRASOUND  DOPPLER ULTRASOUND OF THE TESTICLES  TECHNIQUE: Complete ultrasound examination of the testicles, epididymis, and other scrotal structures was performed. Color and spectral Doppler ultrasound were also utilized to evaluate blood flow to the testicles.  COMPARISON:  None.  FINDINGS: Right testicle  Measurements: 3.9 x 3.0 x 2.6 cm. No mass or microlithiasis visualized.  Left testicle  Measurements: 4.6 x 2.5 x 2.6 cm. No mass or microlithiasis visualized.  Right epididymis:  Normal in size and appearance.  Left epididymis:  Normal in size and appearance.  Hydrocele:  None visualized.  Varicocele:  None visualized.  Pulsed Doppler interrogation of both testes demonstrates low resistance arterial and venous waveforms bilaterally.  Fat was seen in the superior aspect of the right scrotum, compatible with provided history of right-sided hernia. No bowel was  identified extending into the scrotum.  IMPRESSION: 1. Unremarkable appearance of the testes without evidence of mass or torsion. 2. Fat in the superior aspect of the right scrotum, compatible with provided history of hernia. No evidence of bowel herniation.   Electronically Signed   By: Sebastian Ache   On: 05/21/2013 12:04    Anti-infectives: Anti-infectives   Start      Dose/Rate Route Frequency Ordered Stop   05/22/13 1400  piperacillin-tazobactam (ZOSYN) IVPB 3.375 g     3.375 g 12.5 mL/hr over 240 Minutes Intravenous 3 times per day 05/22/13 0803     05/22/13 0815  piperacillin-tazobactam (ZOSYN) IVPB 3.375 g     3.375 g 100 mL/hr over 30 Minutes Intravenous  Once 05/22/13 1478        Assessment/Plan: Incarcerated right inguinal hernia  s/p repair with insertion of mesh(Dr. Magnus Ivan 05/22/13) POD#1 -SCDs, heparin for VTE prophylaxis  -pain control  -DC IVF -change PO pain meds, add ibuprofen -suspect low grade temp from lack of IS, encouraged use Right perirectal abscess  -s/p I&D 1/21 -change to Augmentin for total of 7 days -BID wet to dry dressing changes  Dispo: anticipate discharge tomorrow   LOS: 2 days   Bonner Puna [facility]  ANP-BC Pager [phone]  05/23/2013 8:06 AM

## 2013-05-24 ENCOUNTER — Encounter (HOSPITAL_COMMUNITY): Payer: Self-pay | Admitting: Surgery

## 2013-05-24 MED ORDER — AMOXICILLIN-POT CLAVULANATE 875-125 MG PO TABS: 1.0000 | ORAL_TABLET | Freq: Two times a day (BID) | ORAL | Status: DC

## 2013-05-24 MED ORDER — MUPIROCIN 2 % EX OINT: 1.0000 "application " | TOPICAL_OINTMENT | Freq: Two times a day (BID) | CUTANEOUS | Status: AC

## 2013-05-24 MED ORDER — IBUPROFEN 800 MG PO TABS
800.0000 mg | ORAL_TABLET | Freq: Four times a day (QID) | ORAL | Status: AC
Start: 2013-05-24 — End: 2013-05-31

## 2013-05-24 MED ORDER — OXYCODONE-ACETAMINOPHEN 5-325 MG PO TABS: 1.0000 | ORAL_TABLET | ORAL | Status: DC | PRN

## 2013-05-24 NOTE — Discharge Summary (Signed)
Physician Discharge Summary  Connor Holloway:[id number] DOB: [birth year] DOA: 05/21/2013  PCP: No PCP Per Patient  Consultation: none  Admit date: 05/21/2013 Discharge date: 05/24/2013  Recommendations for Outpatient Follow-up:    Follow-up Information   Follow up with [facility] A, MD. Call in 2 weeks.   Specialty:  General Surgery   Contact information:   [address] [place] [address] [phone]      Discharge Diagnoses:  1. Incarcerated right inguinal hernia 2. Right perirectal abscess    Surgical Procedure: repair with insertion of mesh(Dr. Magnus Ivan 05/22/13)    s/p I&D of perirectal abscess 05/22/13  Discharge Condition: stable Disposition: home  Diet recommendation: regular  Filed Weights   05/21/13 2114  Weight: 167 lb 12.8 oz (76.114 kg)    Filed Vitals:   05/24/13 0506  BP: 148/72  Pulse: 74  Temp: 97.5 F (36.4 C)  Resp: 17      Hospital Course:  Oley presented to [facility] with increasing right inguinal pain as well as a perirectal abscess.   It was found to be incarcerated.  The perirectal abscess was drained at the urgent care, but found inadequate.  He then underwent a hernia repair then I&D.  He was transferred to the floor, diet advanced and mobilized.  On POD#1 he was having pain and therefore kept overnight.  Dressing changes started.  On POD#2 the patient was tolerating a diet, pain controlled, had a BM and therefore felt stable for discharge.  His father is a [employer] fundraiser, I spoke with him and he will do the BID dressing changes, home health not ordered due to patient not being able to afford it.  He was sent home with a course of augmentin, bacitracin for nasal MRSA.  He was advised to follow up in 2 weeks.  Call with questions or concerns.    PE  General appearance: alert, cooperative and no distress  Resp: clear to auscultation bilaterally  Cardio: regular rate and rhythm, S1, S2 normal, no murmur, click, rub or gallop  GI:  soft, non-tender; bowel sounds normal; no masses, no organomegaly RLQ dressing is c/d/i  Discharge Instructions     Medication List         amoxicillin-clavulanate 875-125 MG per tablet  Commonly known as:  AUGMENTIN  Take 1 tablet by mouth 2 (two) times daily.     ibuprofen 800 MG tablet  Commonly known as:  ADVIL,MOTRIN  Take 1 tablet (800 mg total) by mouth 4 (four) times daily.     mupirocin ointment 2 %  Commonly known as:  BACTROBAN  Place 1 application into the nose 2 (two) times daily.     oxyCODONE-acetaminophen 5-325 MG per tablet  Commonly known as:  PERCOCET/ROXICET  Take 1-2 tablets by mouth every 4 (four) hours as needed for severe pain.           Follow-up Information   Schedule an appointment as soon as possible for a visit with Liz Malady, MD.   Specialty:  General Surgery   Contact information:   [address] [place] [address] [phone]       Follow up with [facility] A, MD. Call in 2 weeks.   Specialty:  General Surgery   Contact information:   [address] [place] [address] [phone]        The results of significant diagnostics from this hospitalization (including imaging, microbiology, ancillary and laboratory) are listed below  for reference.    Significant Diagnostic Studies: US Scrotum  05/21/2013   CLINICAL DATA:  Groin pain.  History of hernia.  EXAM: SCROTAL ULTRASOUND  DOPPLER ULTRASOUND OF THE TESTICLES  TECHNIQUE: Complete ultrasound examination of the testicles, epididymis, and other scrotal structures was performed. Color and spectral Doppler ultrasound were also utilized to evaluate blood flow to the testicles.  COMPARISON:  None.  FINDINGS: Right testicle  Measurements: 3.9 x 3.0 x 2.6 cm. No mass or microlithiasis visualized.  Left testicle  Measurements: 4.6 x 2.5 x 2.6 cm. No mass or microlithiasis visualized.  Right epididymis:  Normal in size and appearance.  Left  epididymis:  Normal in size and appearance.  Hydrocele:  None visualized.  Varicocele:  None visualized.  Pulsed Doppler interrogation of both testes demonstrates low resistance arterial and venous waveforms bilaterally.  Fat was seen in the superior aspect of the right scrotum, compatible with provided history of right-sided hernia. No bowel was identified extending into the scrotum.  IMPRESSION: 1. Unremarkable appearance of the testes without evidence of mass or torsion. 2. Fat in the superior aspect of the right scrotum, compatible with provided history of hernia. No evidence of bowel herniation.   Electronically Signed   By: Sebastian Ache   On: 05/21/2013 12:04   [place] Art/ven Flow Abd Pelv Doppler  05/21/2013   CLINICAL DATA:  Groin pain.  History of hernia.  EXAM: SCROTAL ULTRASOUND  DOPPLER ULTRASOUND OF THE TESTICLES  TECHNIQUE: Complete ultrasound examination of the testicles, epididymis, and other scrotal structures was performed. Color and spectral Doppler ultrasound were also utilized to evaluate blood flow to the testicles.  COMPARISON:  None.  FINDINGS: Right testicle  Measurements: 3.9 x 3.0 x 2.6 cm. No mass or microlithiasis visualized.  Left testicle  Measurements: 4.6 x 2.5 x 2.6 cm. No mass or microlithiasis visualized.  Right epididymis:  Normal in size and appearance.  Left epididymis:  Normal in size and appearance.  Hydrocele:  None visualized.  Varicocele:  None visualized.  Pulsed Doppler interrogation of both testes demonstrates low resistance arterial and venous waveforms bilaterally.  Fat was seen in the superior aspect of the right scrotum, compatible with provided history of right-sided hernia. No bowel was identified extending into the scrotum.  IMPRESSION: 1. Unremarkable appearance of the testes without evidence of mass or torsion. 2. Fat in the superior aspect of the right scrotum, compatible with provided history of hernia. No evidence of bowel herniation.   Electronically Signed    By: Sebastian Ache   On: 05/21/2013 12:04    Microbiology: Recent Results (from the past 240 hour(s))  CULTURE, ROUTINE-ABSCESS     Status: None   Collection Time    05/21/13 10:18 AM      Result Value Range Status   Specimen Description BUTTOCKS RIGHT   Final   Special Requests NONE   Final   Gram Stain     Final   Value: NO WBC SEEN     NO SQUAMOUS EPITHELIAL CELLS SEEN     MODERATE GRAM POSITIVE COCCI     IN PAIRS IN CLUSTERS     Performed at [facility]   Culture     Final   Value: MODERATE STAPHYLOCOCCUS AUREUS     Note: RIFAMPIN AND GENTAMICIN SHOULD NOT BE USED AS SINGLE DRUGS FOR TREATMENT OF STAPH INFECTIONS. This organism DOES NOT demonstrate inducible Clindamycin resistance in vitro.     Performed at [facility]  Report Status 05/23/2013 FINAL   Final   Organism ID, Bacteria STAPHYLOCOCCUS AUREUS   Final  SURGICAL PCR SCREEN     Status: Abnormal   Collection Time    05/21/13 10:39 PM      Result Value Range Status   MRSA, PCR POSITIVE (*) NEGATIVE Final   Comment: RESULT CALLED TO, READ BACK BY AND VERIFIED WITH:     Ernestina ColumbiaN. PAGURLA (RN) 0201 05/22/2013 L. LOMAX   Staphylococcus aureus POSITIVE (*) NEGATIVE Final   Comment:            The Xpert SA Assay (FDA     approved for NASAL specimens     in patients over 31 years of age),     is one component of     a comprehensive surveillance     program.  Test performance has     been validated by [facility] for patients greater     than or equal to 31 year old.     It is not intended     to diagnose infection nor to     guide or monitor treatment.     RESULT CALLED TO, READ BACK BY AND VERIFIED WITH:     Ernestina ColumbiaN. PAGURLA (RN) 0201 05/22/2013 L. LOMAX     Labs: Basic Metabolic Panel:  Recent Labs Lab 05/21/13 1431  NA 138  K 4.3  CL 98  CO2 23  GLUCOSE 103*  BUN 15  CREATININE 1.13  CALCIUM 10.4   Liver Function Tests: No results found for this basename: AST, ALT, ALKPHOS, BILITOT, PROT,  ALBUMIN,  in the last 168 hours No results found for this basename: LIPASE, AMYLASE,  in the last 168 hours No results found for this basename: AMMONIA,  in the last 168 hours CBC:  Recent Labs Lab 05/21/13 1431  WBC 15.3*  HGB 17.5*  HCT 49.3  MCV 92.1  PLT 165   Active Problems:   Right inguinal hernia   Perirectal abscess   Signed:  Starlyn Droge, ANP-BC

## 2013-05-24 NOTE — Discharge Summary (Signed)
I have seen and examined the patient and agree with the assessment and plans.  Aadon Gorelik A. Luberta Grabinski  MD, FACS  

## 2013-05-24 NOTE — Discharge Instructions (Signed)
As discussed, it is important that you follow up as soon as possible with your physician for continued management of your condition.  If you develop any new, or concerning changes in your condition, please return to the emergency department immediately.  Inguinal Hernia, Adult Muscles help keep everything in the body in its proper place. But if a weak spot in the muscles develops, something can poke through. That is called a hernia. When this happens in the lower part of the belly (abdomen), it is called an inguinal hernia. (It takes its name from a part of the body in this region called the inguinal canal.) A weak spot in the wall of muscles lets some fat or part of the small intestine bulge through. An inguinal hernia can develop at any age. Men get them more often than women. CAUSES  In adults, an inguinal hernia develops over time.  It can be triggered by:  Suddenly straining the muscles of the lower abdomen.  Lifting heavy objects.  Straining to have a bowel movement. Difficult bowel movements (constipation) can lead to this.  Constant coughing. This may be caused by smoking or lung disease.  Being overweight.  Being pregnant.  Working at a job that requires long periods of standing or heavy lifting.  Having had an inguinal hernia before. One type can be an emergency situation. It is called a strangulated inguinal hernia. It develops if part of the small intestine slips through the weak spot and cannot get back into the abdomen. The blood supply can be cut off. If that happens, part of the intestine may die. This situation requires emergency surgery. SYMPTOMS  Often, a small inguinal hernia has no symptoms. It is found when a healthcare provider does a physical exam. Larger hernias usually have symptoms.   In adults, symptoms may include:  A lump in the groin. This is easier to see when the person is standing. It might disappear when lying down.  In men, a lump in the  scrotum.  Pain or burning in the groin. This occurs especially when lifting, straining or coughing.  A dull ache or feeling of pressure in the groin.  Signs of a strangulated hernia can include:  A bulge in the groin that becomes very painful and tender to the touch.  A bulge that turns red or purple.  Fever, nausea and vomiting.  Inability to have a bowel movement or to pass gas. DIAGNOSIS  To decide if you have an inguinal hernia, a healthcare provider will probably do a physical examination.  This will include asking questions about any symptoms you have noticed.  The healthcare provider might feel the groin area and ask you to cough. If an inguinal hernia is felt, the healthcare provider may try to slide it back into the abdomen.  Usually no other tests are needed. TREATMENT  Treatments can vary. The size of the hernia makes a difference. Options include:  Watchful waiting. This is often suggested if the hernia is small and you have had no symptoms.  No medical procedure will be done unless symptoms develop.  You will need to watch closely for symptoms. If any occur, contact your healthcare provider right away.  Surgery. This is used if the hernia is larger or you have symptoms.  Open surgery. This is usually an outpatient procedure (you will not stay overnight in a hospital). An cut (incision) is made through the skin in the groin. The hernia is put back inside the abdomen. The weak  area in the muscles is then repaired by herniorrhaphy or hernioplasty. Herniorrhaphy: in this type of surgery, the weak muscles are sewn back together. Hernioplasty: a patch or mesh is used to close the weak area in the abdominal wall.  Laparoscopy. In this procedure, a surgeon makes small incisions. A thin tube with a tiny video camera (called a laparoscope) is put into the abdomen. The surgeon repairs the hernia with mesh by looking with the video camera and using two long instruments. HOME  CARE INSTRUCTIONS   After surgery to repair an inguinal hernia:  You will need to take pain medicine prescribed by your healthcare provider. Follow all directions carefully.  You will need to take care of the wound from the incision.  Your activity will be restricted for awhile. This will probably include no heavy lifting for several weeks. You also should not do anything too active for a few weeks. When you can return to work will depend on the type of job that you have.  During "watchful waiting" periods, you should:  Maintain a healthy weight.  Eat a diet high in fiber (fruits, vegetables and whole grains).  Drink plenty of fluids to avoid constipation. This means drinking enough water and other liquids to keep your urine clear or pale yellow.  Do not lift heavy objects.  Do not stand for long periods of time.  Quit smoking. This should keep you from developing a frequent cough. SEEK MEDICAL CARE IF:   A bulge develops in your groin area.  You feel pain, a burning sensation or pressure in the groin. This might be worse if you are lifting or straining.  You develop a fever of more than 100.5 F (38.1 C). SEEK IMMEDIATE MEDICAL CARE IF:   Pain in the groin increases suddenly.  A bulge in the groin gets bigger suddenly and does not go down.  For men, there is sudden pain in the scrotum. Or, the size of the scrotum increases.  A bulge in the groin area becomes red or purple and is painful to touch.  You have nausea or vomiting that does not go away.  You feel your heart beating much faster than normal.  You cannot have a bowel movement or pass gas.  You develop a fever of more than 102.0 F (38.9 C). Document Released: 09/04/2008 Document Revised: 07/11/2011 Document Reviewed: 09/04/2008 ExitCare Patient Information 2014 ExitCare, [place].  WOUND CARE  It is important that the wound be kept open.   -Keeping the skin edges apart will allow the wound to gradually  heal from the base upwards.   - If the skin edges of the wound close too early, a new fluid pocket can form and infection can occur. -This is the reason to pack deeper wounds with gauze or ribbon -This is why drained wounds cannot be sewed closed right away  A healthy wound should form a lining of bright red "beefy" granulating tissue that will help shrink the wound and help the edges grow new skin into it.   -A little mucus / yellow discharge is normal (the body's natural way to try and form a scab) and should be gently washed off with soap and water with daily dressing changes.  -Green or foul smelling drainage implies bacterial colonization and can slow wound healing - a short course of antibiotic ointment (3-5 days) can help it clear up.  Call the doctor if it does not improve or worsens  -Avoid use of antibiotic ointments for  more than a week as they can slow wound healing over time.    -Sometimes other wound care products will be used to reduce need for dressing changes and/or help clean up dirty wounds -Sometimes the surgeon needs to debride the wound in the office to remove dead or infected tissue out of the wound so it can heal more quickly and safely.    Change the dressing at least once a day -Wash the wound with mild soap and water gently every day.  It is good to shower or bathe the wound to help it clean out. -Use clean 4x4 gauze for medium/large wounds or ribbon plain NU-gauze for smaller wounds (it does not need to be sterile, just clean) -Keep the raw wound moist with a little saline or [place] (saline) gel on the gauze.  -A dry wound will take longer to heal.  -Keep the skin dry around the wound to prevent breakdown and irritation. -Pack the wound down to the base -The goal is to keep the skin apart, not overpack the wound -Use a Q-tip or blunt-tipped kabob stick toothpick to push the gauze down to the base in narrow or deep wounds   -Cover with a clean gauze and tape -paper or  Medipore tape tend to be gentle on the skin -rotate the orientation of the tape to avoid repeated stress/trauma on the skin -using an ACE or Coban wrap on wounds on arms or legs can be used instead.  Complete all antibiotics through the entire prescription to help the infection heal and prevent new places of infection   Returning the see the surgeon is helpful to follow the healing process and help the wound close as fast as possible.

## 2013-05-26 NOTE — ED Notes (Signed)
Abscess culture Mod. Staph. Aureus.  Pt. was transferred to ED. Treated with Zosyn IV and Augmentin but [place] NP.  Lab shown to Dr. Artis FlockKindl and he said it is OK. [name], Connor Holloway M 05/26/2013

## 2013-11-14 ENCOUNTER — Emergency Department (HOSPITAL_COMMUNITY)
Admission: EM | Admit: 2013-11-14 | Discharge: 2013-11-14 | Disposition: A | Discharge status: Home / Self Care | Payer: Self-pay | Attending: Emergency Medicine | Admitting: Emergency Medicine

## 2013-11-14 ENCOUNTER — Encounter (HOSPITAL_COMMUNITY): Payer: Self-pay | Admitting: Emergency Medicine

## 2013-11-14 DIAGNOSIS — Z8719 Personal history of other diseases of the digestive system: Secondary | ICD-10-CM | POA: Insufficient documentation

## 2013-11-14 DIAGNOSIS — F17200 Nicotine dependence, unspecified, uncomplicated: Secondary | ICD-10-CM | POA: Insufficient documentation

## 2013-11-14 DIAGNOSIS — Z792 Long term (current) use of antibiotics: Secondary | ICD-10-CM | POA: Insufficient documentation

## 2013-11-14 DIAGNOSIS — H1030 Unspecified acute conjunctivitis, unspecified eye: Secondary | ICD-10-CM | POA: Insufficient documentation

## 2013-11-14 DIAGNOSIS — A499 Bacterial infection, unspecified: Secondary | ICD-10-CM | POA: Insufficient documentation

## 2013-11-14 DIAGNOSIS — H1033 Unspecified acute conjunctivitis, bilateral: Secondary | ICD-10-CM

## 2013-11-14 DIAGNOSIS — B9689 Other specified bacterial agents as the cause of diseases classified elsewhere: Secondary | ICD-10-CM | POA: Insufficient documentation

## 2013-11-14 MED ORDER — ERYTHROMYCIN 5 MG/GM OP OINT: TOPICAL_OINTMENT | OPHTHALMIC | Status: AC

## 2013-11-14 NOTE — ED Notes (Signed)
Pt educated on importance of follow up care to evaluate his high b/p, pt verbalized understanding the extra discharge instructions.

## 2013-11-14 NOTE — ED Notes (Addendum)
Pt presents with reddened conjuctiva, reports son and mother recently treated for pink eye, reports itching, watery eye and burning sensation, been using OTC eye drops with no relief.

## 2013-11-14 NOTE — Discharge Instructions (Signed)
Bacterial Conjunctivitis Bacterial conjunctivitis, commonly called pink eye, is an inflammation of the clear membrane that covers the white part of the eye (conjunctiva). The inflammation can also happen on the underside of the eyelids. The blood vessels in the conjunctiva become inflamed causing the eye to become red or pink. Bacterial conjunctivitis may spread easily from one eye to another and from person to person (contagious).  CAUSES  Bacterial conjunctivitis is caused by bacteria. The bacteria may come from your own skin, your upper respiratory tract, or from someone else with bacterial conjunctivitis. SYMPTOMS  The normally white color of the eye or the underside of the eyelid is usually pink or red. The pink eye is usually associated with irritation, tearing, and some sensitivity to light. Bacterial conjunctivitis is often associated with a thick, yellowish discharge from the eye. The discharge may turn into a crust on the eyelids overnight, which causes your eyelids to stick together. If a discharge is present, there may also be some blurred vision in the affected eye. DIAGNOSIS  Bacterial conjunctivitis is diagnosed by your caregiver through an eye exam and the symptoms that you report. Your caregiver looks for changes in the surface tissues of your eyes, which may point to the specific type of conjunctivitis. A sample of any discharge may be collected on a cotton-tip swab if you have a severe case of conjunctivitis, if your cornea is affected, or if you keep getting repeat infections that do not respond to treatment. The sample will be sent to a lab to see if the inflammation is caused by a bacterial infection and to see if the infection will respond to antibiotic medicines. TREATMENT   Bacterial conjunctivitis is treated with antibiotics. Antibiotic eyedrops are most often used. However, antibiotic ointments are also available. Antibiotics pills are sometimes used. Artificial tears or eye  washes may ease discomfort. HOME CARE INSTRUCTIONS   To ease discomfort, apply a cool, clean wash cloth to your eye for 10-20 minutes, 3-4 times a day.  Gently wipe away any drainage from your eye with a warm, wet washcloth or a cotton ball.  Wash your hands often with soap and water. Use paper towels to dry your hands.  Do not share towels or wash cloths. This may spread the infection.  Change or wash your pillow case every day.  You should not use eye makeup until the infection is gone.  Do not operate machinery or drive if your vision is blurred.  Stop using contacts lenses. Ask your caregiver how to sterilize or replace your contacts before using them again. This depends on the type of contact lenses that you use.  When applying medicine to the infected eye, do not touch the edge of your eyelid with the eyedrop bottle or ointment tube. SEEK IMMEDIATE MEDICAL CARE IF:   Your infection has not improved within 3 days after beginning treatment.  You had yellow discharge from your eye and it returns.  You have increased eye pain.  Your eye redness is spreading.  Your vision becomes blurred.  You have a fever or persistent symptoms for more than 2-3 days.  You have a fever and your symptoms suddenly get worse.  You have facial pain, redness, or swelling. MAKE SURE YOU:   Understand these instructions.  Will watch your condition.  Will get help right away if you are not doing well or get worse. Document Released: 04/18/2005 Document Revised: 01/11/2012 Document Reviewed: 09/19/2011 [facility] Patient Information 2015 [place], [place]. This information is  not intended to replace advice given to you by your health care provider. Make sure you discuss any questions you have with your health care provider.   Emergency Department Resource Guide 1) Find a Doctor and Pay Out of Pocket Although you won't have to find out who is covered by your insurance plan, it is a good idea to  ask around and get recommendations. You will then need to call the office and see if the doctor you have chosen will accept you as a new patient and what types of options they offer for patients who are self-pay. Some doctors offer discounts or will set up payment plans for their patients who do not have insurance, but you will need to ask so you aren't surprised when you get to your appointment.  2) Contact Your Local Health Department Not all health departments have doctors that can see patients for sick visits, but many do, so it is worth a call to see if yours does. If you don't know where your local health department is, you can check in your phone book. The CDC also has a tool to help you locate your state's health department, and many state websites also have listings of all of their local health departments.  3) Find a Walk-in Clinic If your illness is not likely to be very severe or complicated, you may want to try a walk in clinic. These are popping up all over the country in pharmacies, drugstores, and shopping centers. They're usually staffed by nurse practitioners or physician assistants that have been trained to treat common illnesses and complaints. They're usually fairly quick and inexpensive. However, if you have serious medical issues or chronic medical problems, these are probably not your best option.  No Primary Care Doctor: - Call Health Connect at  [phone] - they can help you locate a primary care doctor that  accepts your insurance, provides certain services, etc. - Physician Referral Service- [phone]  Chronic Pain Problems: Organization         Address  Phone   Notes  [facility]  [phone] Patients need to be referred by their primary care doctor.   Medication Assistance: Organization         Address  Phone   Notes  [facility] Medication [facility] [address] [place] [place], [place] [address] [phone] --Must be a  resident of [facility] -- Must have NO insurance coverage whatsoever (no Medicaid/ Medicare, etc.) -- The pt. MUST have a primary care doctor that directs their care regularly and follows them in the community   [facility]  [phone]   [employer]  [phone]    Agencies that provide inexpensive medical care: Organization         Address  Phone   Notes  [facility]  [phone]   [facility]    [phone]   [facility] [address] [place], [place] [address] [phone]   [facility] [address] [place], [place] [phone]   Planned Parenthood    [phone]   [facility]    [phone]   [facility]  [address], [place] Phone:  [phone], Fax:  [phone] Hours of Operation:  9 am - 6 pm, M-F.  Also accepts Medicaid/Medicare and self-pay.  [facility]  [address], [place] Phone: [phone](336) [phone], Fax: [phone](336) [phone]. Hours of Operation:  8:30 am - 5:30 pm, M-F.  Also accepts Medicaid and self-pay.  [facility] [place] [address], [place] Phone: [phone](336) [phone]   [facility] [address], [place], [place] [phone], Ext. [phone] Mondays & Thursdays: 7-9 AM.  First 15 patients are seen on a first come, first serve basis.    Medicaid-accepting [facility] Providers:  Organization         Address  Phone   Notes  [facility] [address], [place] [phone](336) [phone] Also accepts self-pay patients.  [facility] [address], [place]  [phone]) [phone]   [facility] [address], [place] [phone]) [phone]   [facility] [address] [place] [address], [place] [phone](336) [phone]   Renaye RakersVeita Bland [address], [place]   [phone]) [phone] Only accepts  [place] Access [place] patients after they have their name applied to their card.   Self-Pay (no insurance) in [facility]:  Organization         Address  Phone   Notes  Sickle Cell Patients, [facility] [address], [place] [phone]) [phone]   [facility] [address], [place] [phone](336) [phone]   [facility]  [address], [place] [phone]) [phone]   [facility]/Dr. Osei-Bonsu  [address] [place] [address], [place] or [phone], [place] [phone]) [phone] Phone number for both [place] and [place] locations is the same.  [facility] and [facility] [address], [place] [phone]) [phone]   [facility] [address] [place] [address], [place] or [address] [phone]   [facility] [address], [place] [phone](336) [phone], phone; [phone], fax Sees patients 1st and 3rd Saturday of every month.  Must not qualify for public or private insurance (i.e. Medicaid, Medicare, [place] Health Choice, Veterans' Benefits)  Household income should be no more than 200% of the poverty level The clinic cannot treat you if you are pregnant or think you are pregnant  Sexually transmitted diseases are not treated at the clinic.    Dental Care: Organization         Address  Phone  Notes  [facility] Department of [facility] [address], [place] [phone]) [phone] Accepts children up to age 31 who are enrolled in [place] or [place] Health Choice; pregnant women with a Medicaid card; and children who have applied for Medicaid or [place] Health Choice, but were declined, whose parents can pay a reduced fee at time of service.  [facility] of [facility] [place]  [address], [place] [phone]) [phone] Accepts children up to age 31 who are enrolled in [place] or [place] Health Choice; pregnant women  with a Medicaid card; and children who have applied for Medicaid or [place] Health Choice, but were declined, whose parents can pay a reduced fee at time of service.  [facility]  [address], [place] [phone]) [phone] Patients are seen by appointment only. Walk-ins are not accepted. [facility] will see patients 31 years of age and older. Monday - Tuesday (8am-5pm) Most Wednesdays (8:30-5pm) $30 per visit, cash only  [facility]  [address], [facility] [phone] Patients are seen by appointment only. Walk-ins are not accepted. [facility] will see patients 28 years of age and older. One Wednesday Evening (Monthly: Volunteer Based).  $30 per visit, cash only  [employer]  [phone] for adults; Children under age 75, call Graduate Pediatric Dentistry at [phone]. Children aged 69-14, please call [phone] to request a pediatric application.  Dental services are provided in all areas of dental care including fillings, crowns and bridges, complete and partial dentures, implants, gum treatment, root canals, and extractions. Preventive care is also provided. Treatment is provided to both adults and children. Patients are selected via a lottery and there is often a waiting list.   [facility] [address], [place]  [phone] [email or web address]   [facility] [address] [place], [place] [phone], Ext. [phone] Second and Fourth Thursday of each month, opens at 6:30 AM; Clinic ends at 9 AM.  Patients are seen on a first-come first-served basis, and a limited number are seen during each clinic.   [facility]  [address] [place], [place] [phone]   Eligibility Requirements You must have lived in [place], [place], or [place] counties for at least the last three months.   You cannot be eligible for state or federal sponsored [employer], including [employer], [place], or [employer].   You generally cannot be eligible for healthcare insurance through your employer.    How to apply: Eligibility screenings are held every Tuesday and Wednesday afternoon from 1:00 pm until 4:00 pm. You do not need an appointment for the interview!  [facility] [address], [place], [place] [phone]   [facility] Health Department  [phone]   [facility] Health Department  [phone]   [facility] Health Department  [phone]    Behavioral Health Resources in the Community: Intensive Outpatient Programs Organization         Address  Phone  Notes  [facility] [address] [place], [place] [phone]   [facility] [address] [place], [place] [phone]   [facility] [address], [place], [place]  [phone]   [facility] Health [address],  [place], [place] [phone] or [phone]   Substance Abuse Resources Organization         Address  Phone  Notes  Al[facility] Drug Services  [phone]   [facility]  [phone]   [facility]  [phone]   Floydene Flock  [phone]   Residential & Outpatient Substance Abuse Program  [phone]   Psychological Services Organization         Address  Phone  Notes  [facility]  [phone]   [facility]  [phone]   [facility] Mental Health [address] [place] [phone] or [phone]    Mobile Crisis Teams Organization         Address  Phone  Notes  [facility], Mobile Crisis Care Unit  [phone]   [facility]  [address] [place], [place] [phone]   Doristine Locks [address] [place] [phone]    Self-Help/Support Groups Organization         Address  Phone             Notes  Mental  Health Assoc. of [place] -  variety of support groups  [phone]- [phone] Call for more information  Narcotics Anonymous (NA), [facility] [address], [employer] [place]  2 meetings at this location   Residential Sports administrator         Address  Phone  Notes  [facility] [address],    [place]  [phone]   [facility]  [address], [place] [address], [place], [place] [phone]   [facility] [address] [place], [place] [phone] Admissions: 8am-3pm M-F  [facility] [address]    [place], [place] [phone]   [facility] [address] [place], [place], [place] [phone]   [facility] [address]  [place], [place] [phone]   [facility] - Intensive Outpatient [address], [place], [place] [phone]   [facility] ([facility] [address] [place]  [place], [place] [phone] or [phone]   [facility] ([facility]) [address], [place], [place] [phone] Accepts Medicaid  [facility] [place] [address],  [place] [phone] Substance Abuse/Addiction Treatment   [facility] Organization         Address  Phone  Notes  [facility]  [phone]   Angie Fava, PhD [address] [place], [place]   [phone] or [phone]   [facility] Behavioral   [address] [place], [place] [phone]   [facility] [address] [place], [place] [phone] Insurance/Medicaid/sponsorship through [facility] and Families [address]                                    [place], [place] [phone] Therapy/tele-psych/case  [facility] [address], [place] [phone]    Dr. Lolly Mustache  [phone]   [facility] [place]  [employer] [facility] Dept. 1) [address],   2) [address], [place] 3)  [address], [place] [phone]  [phone]   [facility] Child Abuse Hotline 504-497-4555 or [phone] (After Hours)

## 2013-11-14 NOTE — ED Provider Notes (Signed)
CSN: [id number]     Arrival date & time 11/14/13  1614 History  This chart was scribed for Terri Piedra, PA-C, non-physician practitioner working with Toy Baker, MD by Nicholos Johns, ED scribe. This patient was seen in room WTR7/WTR7 and the patient's care was started at 5:14 PM.  Chief Complaint  Patient presents with  . Eye Problem    Reddened eye lids   Patient is a 33 y.o. male presenting with conjunctivitis. The history is provided by the patient. No language interpreter was used.  Conjunctivitis This is a new problem. The current episode started more than 2 days ago. The problem occurs constantly. The problem has been gradually worsening. Pertinent negatives include no shortness of breath. Nothing aggravates the symptoms. Nothing relieves the symptoms. Treatments tried: Eye drops.   HPI Comments: Connor Holloway is a 33 y.o. male who presents to the Emergency Department complaining of gradually worsening eye redness, eye itchiness, and eye drainage; onset 4 days ago. Reports 1 episode of emesis. States it started initially in the left eye 4 days ago and right eye began 1 day ago. States son and son's mother had conjunctivitis recently and were treated for it. Was using some eye drops he bought OTC but states they are not providing relief. Wears corrective lens but has not worn in 2 years. Does not wear contacts. Otherwise healthy. No chronic medial conditions or regular medications. No allergies to report. Denies nausea, ear pain, cough, congestion, or SOB.   Past Medical History  Diagnosis Date  . Hernia    Past Surgical History  Procedure Laterality Date  . Inguinal hernia repair Right 05/22/2013    Procedure: HERNIA REPAIR INGUINAL INCARCERATED WITH MESH;  Surgeon: Shelly Rubenstein, MD;  [facility] OR;  Service: General;  Laterality: Right;  . Irrigation and debridement abscess  05/22/2013    Procedure: IRRIGATION AND DEBRIDEMENT  BUTTOCK ABSCESS;  Surgeon: Shelly Rubenstein, MD;  Location: [facility] OR;  Service: General;;   Family History  Problem Relation Age of Onset  . Hypertension Mother    History  Substance Use Topics  . Smoking status: Current Some Day Smoker -- 0.25 packs/day for 10 years    Types: Cigarettes  . Smokeless tobacco: Never Used  . Alcohol Use: 0.6 oz/week    1 Cans of beer per week    Review of Systems  Constitutional: Negative for fever and chills.  HENT: Negative for congestion and ear pain.   Eyes: Positive for discharge, redness and itching. Negative for visual disturbance.  Respiratory: Negative for cough and shortness of breath.   All other systems reviewed and are negative.  Allergies  Review of patient's allergies indicates no known allergies.  Home Medications   Prior to Admission medications   Medication Sig Start Date End Date Taking? Authorizing Provider  amoxicillin-clavulanate (AUGMENTIN) 875-125 MG per tablet Take 1 tablet by mouth 2 (two) times daily. 05/24/13   Emina Riebock, NP  oxyCODONE-acetaminophen (PERCOCET/ROXICET) 5-325 MG per tablet Take 1-2 tablets by mouth every 4 (four) hours as needed for severe pain. 05/24/13   Ashok Norris, NP   Triage vitals: BP 150/98  Pulse 57  Temp(Src) 98.1 F (36.7 C) (Oral)  Resp 16  Ht 6\' 1"  (1.854 m)  Wt 166 lb (75.297 kg)  BMI 21.91 kg/m2  SpO2 100%  Physical Exam  Nursing note and vitals reviewed. Constitutional: He is oriented to person, place, and time. He appears well-developed and well-nourished. No distress.  HENT:  Head: Normocephalic and atraumatic.  Eyes: EOM are normal. Pupils are equal, round, and reactive to light. Right eye exhibits discharge. Left eye exhibits discharge. Right conjunctiva is injected. Left conjunctiva is injected.  Neck: Neck supple. No tracheal deviation present.  Cardiovascular: Normal rate, regular rhythm and normal heart sounds.  Exam reveals no gallop and no friction rub.   No murmur heard. Pulmonary/Chest: Effort  normal and breath sounds normal. No respiratory distress. He has no wheezes. He has no rales.  Musculoskeletal: Normal range of motion.  Lymphadenopathy:    He has no cervical adenopathy.  Neurological: He is alert and oriented to person, place, and time.  Skin: Skin is warm and dry.  Psychiatric: He has a normal mood and affect. His behavior is normal.    ED Course  Procedures (including critical care time) DIAGNOSTIC STUDIES: Oxygen Saturation is 100% on room air, normal by my interpretation.    COORDINATION OF CARE: At 5:19 PM: Discussed treatment plan with patient which includes vision check and treatment with Az i thromycin strips. Patient agrees.   Labs Review Labs Reviewed - No data to display  Imaging Review No results found.   EKG Interpretation None      MDM   Final diagnoses:  Acute bacterial conjunctivitis of both eyes   Patient is a [name] with bilateral eye injection and discharge.  Patients history consistent with bacterial conjuncivitis.  There is no evidence of orbital cellulitis at this time.  Visual acuity without glasses appears to be stable at this time.  Patient will be discharged home with erythromycin opthalmic ointment BID x 5 days.  Patient was told to wash hands frequently and return if he had signs or symptoms of orbital cellulitis or changes in vision.  He states understanding at this time.  Patient is stable for discharge.  I personally performed the services described in this documentation, which was scribed in my presence. The recorded information has been reviewed and is accurate.     Eben Burowourtney A Forcucci, PA-C 11/14/13 1747

## 2013-11-14 NOTE — ED Provider Notes (Signed)
Medical screening examination/treatment/procedure(s) were performed by non-physician practitioner and as supervising physician I was immediately available for consultation/collaboration.   Johnie T Ivanell Deshotel, MD 11/14/13 2249 

## 2016-07-15 ENCOUNTER — Encounter (HOSPITAL_COMMUNITY): Payer: Self-pay

## 2016-07-15 ENCOUNTER — Emergency Department (HOSPITAL_COMMUNITY): Payer: Self-pay

## 2016-07-15 ENCOUNTER — Emergency Department (HOSPITAL_COMMUNITY)
Admission: EM | Admit: 2016-07-15 | Discharge: 2016-07-15 | Disposition: A | Discharge status: Home / Self Care | Payer: Self-pay | Attending: Emergency Medicine | Admitting: Emergency Medicine

## 2016-07-15 DIAGNOSIS — F17210 Nicotine dependence, cigarettes, uncomplicated: Secondary | ICD-10-CM | POA: Insufficient documentation

## 2016-07-15 DIAGNOSIS — Z79899 Other long term (current) drug therapy: Secondary | ICD-10-CM | POA: Insufficient documentation

## 2016-07-15 DIAGNOSIS — R0789 Other chest pain: Secondary | ICD-10-CM | POA: Insufficient documentation

## 2016-07-15 LAB — BASIC METABOLIC PANEL
Anion gap: 6 (ref 5–15)
BUN: 12 mg/dL (ref 6–20)
CHLORIDE: 105 mmol/L (ref 101–111)
CO2: 27 mmol/L (ref 22–32)
Calcium: 9.9 mg/dL (ref 8.9–10.3)
Creatinine, Ser: 1 mg/dL (ref 0.61–1.24)
GFR calc Af Amer: >60 mL/min (ref >60)
GLUCOSE: 103 mg/dL — AB (ref 65–99)
POTASSIUM: 4.2 mmol/L (ref 3.5–5.1)
Sodium: 138 mmol/L (ref 135–145)

## 2016-07-15 LAB — CBC
HEMATOCRIT: 46.6 % (ref 39.0–52.0)
HEMOGLOBIN: 16.3 g/dL (ref 13.0–17.0)
MCH: 31.8 pg (ref 26.0–34.0)
MCHC: 35 g/dL (ref 30.0–36.0)
MCV: 91 fL (ref 78.0–100.0)
Platelets: 214 10*3/uL (ref 150–400)
RBC: 5.12 MIL/uL (ref 4.22–5.81)
RDW: 12.6 % (ref 11.5–15.5)
WBC: 5.3 10*3/uL (ref 4.0–10.5)

## 2016-07-15 LAB — I-STAT TROPONIN, ED: Troponin i, poc: 0 ng/mL (ref 0.00–0.08)

## 2016-07-15 MED ORDER — IBUPROFEN 800 MG PO TABS
800.0000 mg | ORAL_TABLET | Freq: Once | ORAL | Status: AC
  Administered 2016-07-15: 800 mg via ORAL
  Filled 2016-07-15: qty 1

## 2016-07-15 MED ORDER — OXYCODONE HCL 5 MG PO TABS
5.0000 mg | ORAL_TABLET | Freq: Once | ORAL | Status: AC
  Administered 2016-07-15: 5 mg via ORAL
  Filled 2016-07-15: qty 1

## 2016-07-15 MED ORDER — ACETAMINOPHEN 500 MG PO TABS
1000.0000 mg | ORAL_TABLET | Freq: Once | ORAL | Status: AC
  Administered 2016-07-15: 1000 mg via ORAL
  Filled 2016-07-15: qty 2

## 2016-07-15 NOTE — ED Provider Notes (Addendum)
[facility]-EMERGENCY DEPT Provider Note   CSN: [id number] Arrival date & time: 07/15/16  1235     History   Chief Complaint Chief Complaint  Patient presents with  . Chest Pain    HPI Connor Holloway is a 36 y.o. male.  36 yo M with a cc of chest pain. This been going on for the past couple days. Patient denies any overt injury. He denies hemoptysis denies lower extremity edema denies prior history of PE or DVT. Denies testosterone use. Pain is pinpoint worse with turning of his head moving his arm. Mostly right-sided. Hurts when he takes a deep breath also hurts when he moves his chest wall.   The history is provided by the patient.  Chest Pain   This is a new problem. The current episode started yesterday. The problem occurs constantly. The problem has not changed since onset.The pain is associated with movement. The pain is present in the lateral region. The pain is at a severity of 7/10. The pain is moderate. The quality of the pain is described as pressure-like and sharp. The pain radiates to the upper back. Duration of episode(s) is 2 days. The symptoms are aggravated by certain positions. Associated symptoms include shortness of breath. Pertinent negatives include no abdominal pain, no fever, no headaches, no palpitations and no vomiting. He has tried nothing for the symptoms. The treatment provided no relief. Risk factors include smoking/tobacco exposure.  His past medical history is significant for hypertension (never diagnosed, but always told when he comes to the ED).  Pertinent negatives for past medical history include no DVT, no hyperlipidemia, no MI and no PE.  Pertinent negatives for family medical history include: no early MI.    Past Medical History:  Diagnosis Date  . Hernia     Patient Active Problem List   Diagnosis Date Noted  . Perirectal abscess 05/22/2013  . Right inguinal hernia 05/21/2013  . Inguinal hernia 10/29/2010    Past Surgical History:  Procedure  Laterality Date  . INGUINAL HERNIA REPAIR Right 05/22/2013   Procedure: HERNIA REPAIR INGUINAL INCARCERATED WITH MESH;  Surgeon: Shelly Rubensteinouglas A Blackman, MD;  Location: [facility] OR;  Service: General;  Laterality: Right;  . IRRIGATION AND DEBRIDEMENT ABSCESS  05/22/2013   Procedure: IRRIGATION AND DEBRIDEMENT  BUTTOCK ABSCESS;  Surgeon: Shelly Rubensteinouglas A Blackman, MD;  Location: [facility] OR;  Service: General;;       Home Medications    Prior to Admission medications   Medication Sig Start Date End Date Taking? Authorizing Provider  ibuprofen (ADVIL,MOTRIN) 200 MG tablet Take 200 mg by mouth every 6 (six) hours as needed for mild pain.   Yes Historical Provider, MD  loratadine (CLARITIN) 10 MG tablet Take 10 mg by mouth daily.   Yes Historical Provider, MD    Family History Family History  Problem Relation Age of Onset  . Hypertension Mother     Social History Social History  Substance Use Topics  . Smoking status: Current Some Day Smoker    Packs/day: 0.25    Years: 10.00    Types: Cigarettes  . Smokeless tobacco: Never Used  . Alcohol use 0.6 oz/week    1 Cans of beer per week     Allergies   Patient has no known allergies.   Review of Systems Review of Systems  Constitutional: Negative for chills and fever.  HENT: Negative for congestion and facial swelling.   Eyes: Negative for discharge and visual disturbance.  Respiratory: Positive for shortness of  breath.   Cardiovascular: Positive for chest pain. Negative for palpitations.  Gastrointestinal: Negative for abdominal pain, diarrhea and vomiting.  Musculoskeletal: Negative for arthralgias and myalgias.  Skin: Negative for color change and rash.  Neurological: Negative for tremors, syncope and headaches.  Psychiatric/Behavioral: Negative for confusion and dysphoric mood.     Physical Exam Updated Vital Signs BP (!) 157/100 (BP Location: Left Arm)   Pulse 69   Temp 97.5 F (36.4 C) (Oral)   Resp 11   SpO2 100%   Physical Exam    Constitutional: He is oriented to person, place, and time. He appears well-developed and well-nourished.  HENT:  Head: Normocephalic and atraumatic.  Eyes: EOM are normal. Pupils are equal, round, and reactive to light.  Neck: Normal range of motion. Neck supple. No JVD present.  Cardiovascular: Normal rate and regular rhythm.  Exam reveals no gallop and no friction rub.   No murmur heard. Pulmonary/Chest: No respiratory distress. He has no wheezes. He exhibits tenderness (Pinpoint to the right anterior chest wall as well as the right trapezius).  Abdominal: He exhibits no distension and no mass. There is no tenderness. There is no rebound and no guarding.  Musculoskeletal: Normal range of motion.  Neurological: He is alert and oriented to person, place, and time.  Skin: No rash noted. No pallor.  Psychiatric: He has a normal mood and affect. His behavior is normal.  Nursing note and vitals reviewed.    ED Treatments / Results  Labs (all labs ordered are listed, but only abnormal results are displayed) Labs Reviewed  BASIC METABOLIC PANEL - Abnormal; Notable for the following:       Result Value   Glucose, Bld 103 (*)    All other components within normal limits  CBC  I-STAT TROPOININ, ED    EKG  EKG Interpretation  Date/Time:  Friday July 15 2016 12:44:17 EDT Ventricular Rate:  72 PR Interval:    QRS Duration: 93 QT Interval:  420 QTC Calculation: 460 R Axis:   72 Text Interpretation:  Sinus rhythm Biatrial enlargement No old tracing to compare Confirmed by Yanissa Michalsky MD, DANIEL [phone]) on 07/15/2016 2:50:58 PM       Radiology Dg Chest 2 View  Result Date: 07/15/2016 CLINICAL DATA:  Mid chest pain radiating to rt. Shoulder and moving to posterior rt. Upper back, pain when moving and taking in deep breath. Smoker, pt denies cough or cold EXAM: CHEST - 2 VIEW COMPARISON:  07/01/2003 FINDINGS: Lungs are clear. Heart size and mediastinal contours are within normal limits. No  effusion. Visualized bones unremarkable. IMPRESSION: No acute cardiopulmonary disease. Electronically Signed   By: Corlis Leak M.D.   On: 07/15/2016 13:35    Procedures Procedures (including critical care time)  Medications Ordered in ED Medications  acetaminophen (TYLENOL) tablet 1,000 mg (not administered)  ibuprofen (ADVIL,MOTRIN) tablet 800 mg (not administered)  oxyCODONE (Oxy IR/ROXICODONE) immediate release tablet 5 mg (not administered)     Initial Impression / Assessment and Plan / ED Course  I have reviewed the triage vital signs and the nursing notes.  Pertinent labs & imaging results that were available during my care of the patient were reviewed by me and considered in my medical decision making (see chart for details).     62 yoM With a chief complaint of chest pain. Is reproduced on exam by palpation. Suspect this is muscular skeletal. Discharge home. He is PERC negative.    3:19 PM:  I have discussed the  diagnosis/risks/treatment options with the patient and family and believe the pt to be eligible for discharge home to follow-up with PCP. We also discussed returning to the ED immediately if new or worsening sx occur. We discussed the sx which are most concerning (e.g., sudden worsening pain, fever, inability to tolerate by mouth) that necessitate immediate return. Medications administered to the patient during their visit and any new prescriptions provided to the patient are listed below.  Medications given during this visit Medications  acetaminophen (TYLENOL) tablet 1,000 mg (not administered)  ibuprofen (ADVIL,MOTRIN) tablet 800 mg (not administered)  oxyCODONE (Oxy IR/ROXICODONE) immediate release tablet 5 mg (not administered)     The patient appears reasonably screen and/or stabilized for discharge and I doubt any other medical condition or other [facility] requiring further screening, evaluation, or treatment in the ED at this time prior to discharge.   Final  Clinical Impressions(s) / ED Diagnoses   Final diagnoses:  Chest wall pain    New Prescriptions New Prescriptions   No medications on file     Melene Plan, DO 07/15/16 1518    Melene Plan, DO 07/15/16 1519

## 2016-07-15 NOTE — Discharge Instructions (Signed)
Follow up with your PCP.  ° °Take 4 over the counter ibuprofen tablets 3 times a day or 2 over-the-counter naproxen tablets twice a day for pain. °Also take tylenol 1000mg(2 extra strength) four times a day.  ° ° °

## 2016-07-15 NOTE — ED Triage Notes (Signed)
Pt with central chest pain since yesterday.  Radiates to back.  Pt states no cough.  Some shortness of breath.

## 2017-01-24 ENCOUNTER — Emergency Department (HOSPITAL_COMMUNITY)
Admission: EM | Admit: 2017-01-24 | Discharge: 2017-01-24 | Disposition: A | Discharge status: Home / Self Care | Payer: Self-pay | Attending: Emergency Medicine | Admitting: Emergency Medicine

## 2017-01-24 ENCOUNTER — Encounter (HOSPITAL_COMMUNITY): Payer: Self-pay | Admitting: Neurology

## 2017-01-24 DIAGNOSIS — F17210 Nicotine dependence, cigarettes, uncomplicated: Secondary | ICD-10-CM | POA: Insufficient documentation

## 2017-01-24 DIAGNOSIS — I10 Essential (primary) hypertension: Secondary | ICD-10-CM | POA: Insufficient documentation

## 2017-01-24 DIAGNOSIS — Z79899 Other long term (current) drug therapy: Secondary | ICD-10-CM | POA: Insufficient documentation

## 2017-01-24 DIAGNOSIS — B349 Viral infection, unspecified: Secondary | ICD-10-CM | POA: Insufficient documentation

## 2017-01-24 HISTORY — DX: Essential (primary) hypertension: I10

## 2017-01-24 LAB — COMPREHENSIVE METABOLIC PANEL
ALK PHOS: 66 U/L (ref 38–126)
ALT: 25 U/L (ref 17–63)
AST: 29 U/L (ref 15–41)
Albumin: 4.5 g/dL (ref 3.5–5.0)
Anion gap: 7 (ref 5–15)
BILIRUBIN TOTAL: 0.7 mg/dL (ref 0.3–1.2)
BUN: 13 mg/dL (ref 6–20)
CALCIUM: 9.4 mg/dL (ref 8.9–10.3)
CO2: 29 mmol/L (ref 22–32)
CREATININE: 1.09 mg/dL (ref 0.61–1.24)
Chloride: 102 mmol/L (ref 101–111)
Glucose, Bld: 119 mg/dL — ABNORMAL HIGH (ref 65–99)
Potassium: 4 mmol/L (ref 3.5–5.1)
Sodium: 138 mmol/L (ref 135–145)
TOTAL PROTEIN: 7.5 g/dL (ref 6.5–8.1)

## 2017-01-24 LAB — CBC
HCT: 48.4 % (ref 39.0–52.0)
Hemoglobin: 16.4 g/dL (ref 13.0–17.0)
MCH: 31.1 pg (ref 26.0–34.0)
MCHC: 33.9 g/dL (ref 30.0–36.0)
MCV: 91.8 fL (ref 78.0–100.0)
PLATELETS: 162 10*3/uL (ref 150–400)
RBC: 5.27 MIL/uL (ref 4.22–5.81)
RDW: 12.8 % (ref 11.5–15.5)
WBC: 5.4 10*3/uL (ref 4.0–10.5)

## 2017-01-24 LAB — LIPASE, BLOOD: Lipase: 50 U/L (ref 11–51)

## 2017-01-24 MED ORDER — ONDANSETRON 4 MG PO TBDP
4.0000 mg | ORAL_TABLET | Freq: Once | ORAL | Status: AC
  Administered 2017-01-24: 4 mg via ORAL
  Filled 2017-01-24: qty 1

## 2017-01-24 MED ORDER — IBUPROFEN 800 MG PO TABS
800.0000 mg | ORAL_TABLET | Freq: Once | ORAL | Status: AC
  Administered 2017-01-24: 800 mg via ORAL
  Filled 2017-01-24: qty 1

## 2017-01-24 MED ORDER — IBUPROFEN 800 MG PO TABS: 800.0000 mg | ORAL_TABLET | Freq: Three times a day (TID) | ORAL | 0 refills | Status: DC

## 2017-01-24 MED ORDER — ONDANSETRON HCL 4 MG PO TABS: 4.0000 mg | ORAL_TABLET | Freq: Four times a day (QID) | ORAL | 0 refills | Status: DC

## 2017-01-24 NOTE — ED Provider Notes (Signed)
[facility]-EMERGENCY DEPT Provider Note   CSN: [id number] Arrival date & time: 01/24/17  4098   History   Chief Complaint Chief Complaint  Patient presents with  . Emesis  . Nausea  . Chills    HPI Connor Holloway is a 36 y.o. male.  HPI  Pt has a PMH of perirectal abscess and right inguinal hernia. For 1 week he has been having headaches, body aches, nausea, a few episodes of diarrhea, ear pain, sore throat, and overall not feeling well. He has been around kids. He works with food. He did not get a flu shot this year.  He has not had any fevers, syncope, SOB, CP, back pain, confusion, dysuria, wounds, le swelling.  Past Medical History:  Diagnosis Date  . Hernia   . Hypertension     Patient Active Problem List   Diagnosis Date Noted  . Perirectal abscess 05/22/2013  . Right inguinal hernia 05/21/2013  . Inguinal hernia 10/29/2010    Past Surgical History:  Procedure Laterality Date  . INGUINAL HERNIA REPAIR Right 05/22/2013   Procedure: HERNIA REPAIR INGUINAL INCARCERATED WITH MESH;  Surgeon: Shelly Rubenstein, MD;  Location: [facility] OR;  Service: General;  Laterality: Right;  . IRRIGATION AND DEBRIDEMENT ABSCESS  05/22/2013   Procedure: IRRIGATION AND DEBRIDEMENT  BUTTOCK ABSCESS;  Surgeon: Shelly Rubenstein, MD;  Location: [facility] OR;  Service: General;;       Home Medications    Prior to Admission medications   Medication Sig Start Date End Date Taking? Authorizing Provider  ibuprofen (ADVIL,MOTRIN) 200 MG tablet Take 200 mg by mouth every 6 (six) hours as needed for mild pain.    [provider]  ibuprofen (ADVIL,MOTRIN) 800 MG tablet Take 1 tablet (800 mg total) by mouth 3 (three) times daily. 01/24/17   Marlon Pel, PA-C  loratadine (CLARITIN) 10 MG tablet Take 10 mg by mouth daily.    [provider]  ondansetron (ZOFRAN) 4 MG tablet Take 1 tablet (4 mg total) by mouth every 6 (six) hours. 01/24/17   Marlon Pel, PA-C    Family History Family  History  Problem Relation Age of Onset  . Hypertension Mother     Social History Social History  Substance Use Topics  . Smoking status: Current Some Day Smoker    Packs/day: 0.25    Years: 10.00    Types: Cigarettes  . Smokeless tobacco: Never Used  . Alcohol use 0.6 oz/week    1 Cans of beer per week     Allergies   Patient has no known allergies.   Review of Systems Review of Systems  Negative ROS aside from pertinent positives and negatives as listed in HPI  Physical Exam Updated Vital Signs BP (!) 141/105 (BP Location: Left Arm)   Pulse 79   Temp 98.7 F (37.1 C) (Oral)   Resp 19   Ht 6' (1.829 m)   Wt 79.4 kg (175 lb)   SpO2 100%   BMI 23.73 kg/m   Physical Exam  Constitutional: He appears well-developed and well-nourished. No distress.  HENT:  Head: Normocephalic and atraumatic.  Right Ear: Tympanic membrane and ear canal normal.  Left Ear: Tympanic membrane and ear canal normal.  Nose: Nose normal.  Mouth/Throat: Uvula is midline, oropharynx is clear and moist and mucous membranes are normal.  Eyes: Pupils are equal, round, and reactive to light.  Neck: Normal range of motion. Neck supple.  Cardiovascular: Normal rate and regular rhythm.   Pulmonary/Chest: Effort  normal.  Abdominal: Soft.  No signs of abdominal distention  Musculoskeletal:  No LE swelling  Neurological: He is alert.  Acting at baseline  Skin: Skin is warm and dry. No rash noted.  Nursing note and vitals reviewed.    ED Treatments / Results  Labs (all labs ordered are listed, but only abnormal results are displayed) Labs Reviewed  COMPREHENSIVE METABOLIC PANEL - Abnormal; Notable for the following:       Result Value   Glucose, Bld 119 (*)    All other components within normal limits  LIPASE, BLOOD  CBC  URINALYSIS, ROUTINE W REFLEX MICROSCOPIC    EKG  EKG Interpretation None       Radiology No results found.  Procedures Procedures (including critical care  time)  Medications Ordered in ED Medications  ibuprofen (ADVIL,MOTRIN) tablet 800 mg (not administered)  ondansetron (ZOFRAN-ODT) disintegrating tablet 4 mg (not administered)     Initial Impression / Assessment and Plan / ED Course  I have reviewed the triage vital signs and the nursing notes.  Pertinent labs & imaging results that were available during my care of the patient were reviewed by me and considered in my medical decision making (see chart for details).     1. Medications: flonase, mucinex, tessalon, usual home medications 2. Treatment: rest, drink plenty of fluids, take tylenol or ibuprofen for fever control 3. Follow Up: Please followup with your primary doctor in 3 days for discussion of your diagnoses and further evaluation after today's visit; if you do not have a primary care doctor use the resource guide provided to find one; Return to the ER for high fevers, difficulty breathing or other concerning symptoms   Final Clinical Impressions(s) / ED Diagnoses   Final diagnoses:  Viral syndrome    New Prescriptions New Prescriptions   IBUPROFEN (ADVIL,MOTRIN) 800 MG TABLET    Take 1 tablet (800 mg total) by mouth 3 (three) times daily.   ONDANSETRON (ZOFRAN) 4 MG TABLET    Take 1 tablet (4 mg total) by mouth every 6 (six) hours.     Marlon Pel, PA-C 01/24/17 1100    Cardama, Amadeo Garnet, MD 01/26/17 [id number]

## 2017-01-24 NOTE — ED Triage Notes (Signed)
Pt reports n/v/d x 1 week. Chills, body aches. Has been taking Claritin with no relief. Is a x 4. Is wearing mask.

## 2017-04-24 ENCOUNTER — Emergency Department (HOSPITAL_BASED_OUTPATIENT_CLINIC_OR_DEPARTMENT_OTHER): Payer: Self-pay

## 2017-04-24 ENCOUNTER — Emergency Department (HOSPITAL_BASED_OUTPATIENT_CLINIC_OR_DEPARTMENT_OTHER)
Admission: EM | Admit: 2017-04-24 | Discharge: 2017-04-24 | Disposition: A | Discharge status: Home / Self Care | Payer: Self-pay | Attending: Emergency Medicine | Admitting: Emergency Medicine

## 2017-04-24 ENCOUNTER — Encounter (HOSPITAL_BASED_OUTPATIENT_CLINIC_OR_DEPARTMENT_OTHER): Payer: Self-pay | Admitting: Adult Health

## 2017-04-24 ENCOUNTER — Other Ambulatory Visit: Payer: Self-pay

## 2017-04-24 DIAGNOSIS — N50811 Right testicular pain: Secondary | ICD-10-CM | POA: Insufficient documentation

## 2017-04-24 DIAGNOSIS — Z87891 Personal history of nicotine dependence: Secondary | ICD-10-CM | POA: Insufficient documentation

## 2017-04-24 DIAGNOSIS — R197 Diarrhea, unspecified: Secondary | ICD-10-CM | POA: Insufficient documentation

## 2017-04-24 DIAGNOSIS — N50812 Left testicular pain: Secondary | ICD-10-CM | POA: Insufficient documentation

## 2017-04-24 DIAGNOSIS — R112 Nausea with vomiting, unspecified: Secondary | ICD-10-CM | POA: Insufficient documentation

## 2017-04-24 DIAGNOSIS — R6883 Chills (without fever): Secondary | ICD-10-CM | POA: Insufficient documentation

## 2017-04-24 DIAGNOSIS — I10 Essential (primary) hypertension: Secondary | ICD-10-CM | POA: Insufficient documentation

## 2017-04-24 DIAGNOSIS — R6889 Other general symptoms and signs: Secondary | ICD-10-CM

## 2017-04-24 LAB — CBC WITH DIFFERENTIAL/PLATELET
Basophils Absolute: 0 10*3/uL (ref 0.0–0.1)
Basophils Relative: 0 %
EOS ABS: 0.1 10*3/uL (ref 0.0–0.7)
Eosinophils Relative: 1 %
HCT: 46.3 % (ref 39.0–52.0)
HEMOGLOBIN: 15.6 g/dL (ref 13.0–17.0)
LYMPHS ABS: 0.3 10*3/uL — AB (ref 0.7–4.0)
LYMPHS PCT: 4 %
MCH: 30.6 pg (ref 26.0–34.0)
MCHC: 33.7 g/dL (ref 30.0–36.0)
MCV: 91 fL (ref 78.0–100.0)
Monocytes Absolute: 0.5 10*3/uL (ref 0.1–1.0)
Monocytes Relative: 5 %
NEUTROS PCT: 90 %
Neutro Abs: 8.6 10*3/uL — ABNORMAL HIGH (ref 1.7–7.7)
Platelets: 178 10*3/uL (ref 150–400)
RBC: 5.09 MIL/uL (ref 4.22–5.81)
RDW: 12.9 % (ref 11.5–15.5)
WBC: 9.5 10*3/uL (ref 4.0–10.5)

## 2017-04-24 LAB — HEPATIC FUNCTION PANEL
ALBUMIN: 5.3 g/dL — AB (ref 3.5–5.0)
ALK PHOS: 73 U/L (ref 38–126)
ALT: 33 U/L (ref 17–63)
AST: 35 U/L (ref 15–41)
Bilirubin, Direct: <0.1 mg/dL — ABNORMAL LOW (ref 0.1–0.5)
TOTAL PROTEIN: 8.7 g/dL — AB (ref 6.5–8.1)
Total Bilirubin: 0.7 mg/dL (ref 0.3–1.2)

## 2017-04-24 LAB — URINALYSIS, MICROSCOPIC (REFLEX): WBC UA: NONE SEEN WBC/hpf (ref 0–5)

## 2017-04-24 LAB — BASIC METABOLIC PANEL
Anion gap: 9 (ref 5–15)
BUN: 10 mg/dL (ref 6–20)
CHLORIDE: 103 mmol/L (ref 101–111)
CO2: 26 mmol/L (ref 22–32)
Calcium: 9.7 mg/dL (ref 8.9–10.3)
Creatinine, Ser: 0.92 mg/dL (ref 0.61–1.24)
GFR calc Af Amer: >60 mL/min (ref >60)
GFR calc non Af Amer: >60 mL/min (ref >60)
Glucose, Bld: 98 mg/dL (ref 65–99)
POTASSIUM: 3.7 mmol/L (ref 3.5–5.1)
SODIUM: 138 mmol/L (ref 135–145)

## 2017-04-24 LAB — URINALYSIS, ROUTINE W REFLEX MICROSCOPIC
BILIRUBIN URINE: NEGATIVE
Glucose, UA: NEGATIVE mg/dL
Ketones, ur: NEGATIVE mg/dL
LEUKOCYTES UA: NEGATIVE
NITRITE: NEGATIVE
Protein, ur: NEGATIVE mg/dL
pH: 6 (ref 5.0–8.0)

## 2017-04-24 LAB — LIPASE, BLOOD: Lipase: 30 U/L (ref 11–51)

## 2017-04-24 MED ORDER — DICYCLOMINE HCL 10 MG PO CAPS
10.0000 mg | ORAL_CAPSULE | Freq: Once | ORAL | Status: AC
  Administered 2017-04-24: 10 mg via ORAL
  Filled 2017-04-24: qty 1

## 2017-04-24 MED ORDER — IBUPROFEN 800 MG PO TABS
800.0000 mg | ORAL_TABLET | Freq: Once | ORAL | Status: AC
  Administered 2017-04-24: 800 mg via ORAL
  Filled 2017-04-24: qty 1

## 2017-04-24 MED ORDER — DICYCLOMINE HCL 20 MG PO TABS: 20.0000 mg | ORAL_TABLET | Freq: Two times a day (BID) | ORAL | 0 refills | Status: AC

## 2017-04-24 MED ORDER — SODIUM CHLORIDE 0.9 % IV BOLUS (SEPSIS)
1000.0000 mL | Freq: Once | INTRAVENOUS | Status: AC
  Administered 2017-04-24: 1000 mL via INTRAVENOUS

## 2017-04-24 MED ORDER — METHOCARBAMOL 500 MG PO TABS
500.0000 mg | ORAL_TABLET | Freq: Once | ORAL | Status: AC
  Administered 2017-04-24: 500 mg via ORAL
  Filled 2017-04-24: qty 1

## 2017-04-24 MED ORDER — ONDANSETRON 4 MG PO TBDP: 4.0000 mg | ORAL_TABLET | Freq: Three times a day (TID) | ORAL | 0 refills | Status: DC | PRN

## 2017-04-24 MED ORDER — ONDANSETRON HCL 4 MG/2ML IJ SOLN
4.0000 mg | Freq: Once | INTRAMUSCULAR | Status: AC
  Administered 2017-04-24: 4 mg via INTRAVENOUS
  Filled 2017-04-24: qty 2

## 2017-04-24 MED ORDER — METHOCARBAMOL 500 MG PO TABS: 500.0000 mg | ORAL_TABLET | Freq: Two times a day (BID) | ORAL | 0 refills | Status: DC

## 2017-04-24 MED ORDER — IBUPROFEN 800 MG PO TABS: 800.0000 mg | ORAL_TABLET | Freq: Three times a day (TID) | ORAL | 0 refills | Status: DC | PRN

## 2017-04-24 NOTE — ED Provider Notes (Signed)
Emergency Department Provider Note   I have reviewed the triage vital signs and the nursing notes.   HISTORY  Chief Complaint Emesis   HPI Connor Holloway is a 36 y.o. male with PMH of hernia s/p repair and HTN resents to the emergency department for evaluation of nausea, vomiting, diarrhea, chills, with some associated testicle pain.  Symptoms began this morning.  Denies any dysuria or penile discharge.  He reports pain in both testicles.  No sudden onset or intermittent testicle pain.  Tried over-the-counter medications with no relief in symptoms.  States he had the flu earlier this year and thought that that may be what this is but has not had any respiratory symptoms.   Past Medical History:  Diagnosis Date  . Hernia   . Hypertension     Patient Active Problem List   Diagnosis Date Noted  . Perirectal abscess 05/22/2013  . Right inguinal hernia 05/21/2013  . Inguinal hernia 10/29/2010    Past Surgical History:  Procedure Laterality Date  . INGUINAL HERNIA REPAIR Right 05/22/2013   Procedure: HERNIA REPAIR INGUINAL INCARCERATED WITH MESH;  Surgeon: Shelly Rubensteinouglas A Blackman, MD;  Location: [facility] OR;  Service: General;  Laterality: Right;  . IRRIGATION AND DEBRIDEMENT ABSCESS  05/22/2013   Procedure: IRRIGATION AND DEBRIDEMENT  BUTTOCK ABSCESS;  Surgeon: Shelly Rubensteinouglas A Blackman, MD;  Location: [facility] OR;  Service: General;;    Current Outpatient Rx  . Order #: [id number] Class: Print  . Order #: [id number] Class: Print  . Order #: [id number] Class: Historical Med  . Order #: [id number] Class: Print  . Order #: [id number] Class: Historical Med  . Order #: [id number] Class: Print  . Order #: [id number] Class: Print    Allergies Patient has no known allergies.  Family History  Problem Relation Age of Onset  . Hypertension Mother     Social History Social History   Tobacco Use  . Smoking status: Former Smoker    Packs/day: 0.25    Years: 10.00    Pack years: 2.50    Types: Cigarettes  .  Smokeless tobacco: Never Used  Substance Use Topics  . Alcohol use: Yes    Alcohol/week: 0.6 oz    Types: 1 Cans of beer per week  . Drug use: Yes    Types: Marijuana    Review of Systems  Constitutional: No fever/chills Eyes: No visual changes. ENT: No sore throat. Cardiovascular: Denies chest pain. Respiratory: Denies shortness of breath. Gastrointestinal: No abdominal pain. Positive nausea, vomiting, and diarrhea.  No constipation. Genitourinary: Negative for dysuria. Positive bilateral testicle pain.  Musculoskeletal: Positive for back pain and generalized muscle aches.  Skin: Negative for rash. Neurological: Negative for headaches, focal weakness or numbness.  10-point ROS otherwise negative.  ____________________________________________   PHYSICAL EXAM:  VITAL SIGNS: ED Triage Vitals  Enc Vitals Group     BP 04/24/17 1647 (!) 163/116     Pulse Rate 04/24/17 1647 83     Resp 04/24/17 1647 18     Temp 04/24/17 1647 98.8 F (37.1 C)     Temp Source 04/24/17 1647 Oral     SpO2 04/24/17 1647 97 %     Weight 04/24/17 1648 180 lb (81.6 kg)     Height 04/24/17 1648 6\' 1"  (1.854 m)     Pain Score 04/24/17 1647 10   Constitutional: Alert and oriented. Well appearing and in no acute distress. Eyes: Conjunctivae are normal.  Head: Atraumatic. Nose: No congestion/rhinnorhea. Mouth/Throat: Mucous membranes are moist.  Oropharynx non-erythematous. Neck:  No stridor. Cardiovascular: Normal rate, regular rhythm. Good peripheral circulation. Grossly normal heart sounds.   Respiratory: Normal respiratory effort.  No retractions. Lungs CTAB. Gastrointestinal: Soft and nontender. No distention.  Musculoskeletal: No lower extremity tenderness nor edema. No gross deformities of extremities. Neurologic:  Normal speech and language. No gross focal neurologic deficits are appreciated.  Skin:  Skin is warm, dry and intact. No rash  noted.  ____________________________________________   LABS (all labs ordered are listed, but only abnormal results are displayed)  Labs Reviewed  URINALYSIS, ROUTINE W REFLEX MICROSCOPIC - Abnormal; Notable for the following components:      Result Value   Specific Gravity, Urine >1.030 (*)    Hgb urine dipstick SMALL (*)    All other components within normal limits  CBC WITH DIFFERENTIAL/PLATELET - Abnormal; Notable for the following components:   Neutro Abs 8.6 (*)    Lymphs Abs 0.3 (*)    All other components within normal limits  HEPATIC FUNCTION PANEL - Abnormal; Notable for the following components:   Total Protein 8.7 (*)    Albumin 5.3 (*)    Bilirubin, Direct <0.1 (*)    All other components within normal limits  URINALYSIS, MICROSCOPIC (REFLEX) - Abnormal; Notable for the following components:   Bacteria, UA RARE (*)    Squamous Epithelial / LPF 0-5 (*)    All other components within normal limits  BASIC METABOLIC PANEL  LIPASE, BLOOD   ____________________________________________  RADIOLOGY  US Scrotum  Result Date: 04/24/2017 CLINICAL DATA:  36 year old male with scrotal pain x2 days. EXAM: SCROTAL ULTRASOUND DOPPLER ULTRASOUND OF THE TESTICLES TECHNIQUE: Complete ultrasound examination of the testicles, epididymis, and other scrotal structures was performed. Color and spectral Doppler ultrasound were also utilized to evaluate blood flow to the testicles. COMPARISON:  None. FINDINGS: Right testicle Measurements: 4.5 x 2.0 x 3.2 cm. No mass or microlithiasis visualized. Left testicle Measurements: 4.5 x 1.8 x 2.9 cm. No mass or microlithiasis visualized. Right epididymis:  Normal in size and appearance. Left epididymis: Slight heterogeneity of the left epididymis may be related to small epididymal head cyst. Hydrocele:  Small left hydrocele. Varicocele:  None visualized. Pulsed Doppler interrogation of both testes demonstrates normal low resistance arterial and venous  waveforms bilaterally. IMPRESSION: Small left hydrocele otherwise unremarkable testicular ultrasound. Electronically Signed   By: Elgie Collard M.D.   On: 04/24/2017 19:41   [place] Art/ven Flow Abd Pelv Doppler  Result Date: 04/24/2017 CLINICAL DATA:  36 year old male with scrotal pain x2 days. EXAM: SCROTAL ULTRASOUND DOPPLER ULTRASOUND OF THE TESTICLES TECHNIQUE: Complete ultrasound examination of the testicles, epididymis, and other scrotal structures was performed. Color and spectral Doppler ultrasound were also utilized to evaluate blood flow to the testicles. COMPARISON:  None. FINDINGS: Right testicle Measurements: 4.5 x 2.0 x 3.2 cm. No mass or microlithiasis visualized. Left testicle Measurements: 4.5 x 1.8 x 2.9 cm. No mass or microlithiasis visualized. Right epididymis:  Normal in size and appearance. Left epididymis: Slight heterogeneity of the left epididymis may be related to small epididymal head cyst. Hydrocele:  Small left hydrocele. Varicocele:  None visualized. Pulsed Doppler interrogation of both testes demonstrates normal low resistance arterial and venous waveforms bilaterally. IMPRESSION: Small left hydrocele otherwise unremarkable testicular ultrasound. Electronically Signed   By: Elgie Collard M.D.   On: 04/24/2017 19:41    ____________________________________________   PROCEDURES  Procedure(s) performed:   Procedures  None ____________________________________________   INITIAL IMPRESSION / ASSESSMENT AND PLAN / ED COURSE  Pertinent labs &  imaging results that were available during my care of the patient were reviewed by me and considered in my medical decision making (see chart for details).  Patient presents to the emergency department for evaluation of primarily abdominal symptoms with some testicle pain.  Low suspicion for torsion but given his discomfort on exam plan for ultrasound to rule this out.  Suspect viral etiology.  No focal tenderness.  Patient US  negative for acute pathology. MSK pain likely related to viral process. No evidence of serious bacterial infection on exam.   At this time, I do not feel there is any life-threatening condition present. I have reviewed and discussed all results (EKG, imaging, lab, urine as appropriate), exam findings with patient. I have reviewed nursing notes and appropriate previous records.  I feel the patient is safe to be discharged home without further emergent workup. Discussed usual and customary return precautions. Patient and family (if present) verbalize understanding and are comfortable with this plan.  Patient will follow-up with their primary care provider. If they do not have a primary care provider, information for follow-up has been provided to them. All questions have been answered.  ____________________________________________  FINAL CLINICAL IMPRESSION(S) / ED DIAGNOSES  Final diagnoses:  Flu-like symptoms  Nausea vomiting and diarrhea     MEDICATIONS GIVEN DURING THIS VISIT:  Medications  ondansetron (ZOFRAN) injection 4 mg (4 mg Intravenous Given 04/24/17 1719)  sodium chloride 0.9 % bolus 1,000 mL (0 mLs Intravenous Stopped 04/24/17 1902)  dicyclomine (BENTYL) capsule 10 mg (10 mg Oral Given 04/24/17 1814)  methocarbamol (ROBAXIN) tablet 500 mg (500 mg Oral Given 04/24/17 2002)  ibuprofen (ADVIL,MOTRIN) tablet 800 mg (800 mg Oral Given 04/24/17 2002)    Note:  This document was prepared using Dragon voice recognition software and may include unintentional dictation errors.  Alona BeneJoshua Danylle Ouk, MD Emergency Medicine    Joshalyn Ancheta, Arlyss RepressJoshua G, MD 04/24/17 2139

## 2017-04-24 NOTE — Discharge Instructions (Signed)

## 2017-04-24 NOTE — ED Notes (Addendum)
Resting prone. Therapeutic back positioning discussed. Alert, NAD, calm, interactive, resps e/u, speaking in clear complete sentences, no dyspnea noted, skin W&D, VSS, c/o 10/10 pain at this time, (denies: NVD, sob, itching, dizziness or visual changes). [facility]

## 2017-04-24 NOTE — ED Triage Notes (Signed)
PResents with nausea, vomiting, diarrhea, chills, and testicle pain that began this AM. He denies pain with urination and penile discharge. Dneis testicle swelling. He states everything below his waist hurts. HE tried thera-flu without relief.

## 2017-04-24 NOTE — ED Notes (Addendum)
Drawing pt's blood pt got very sweaty and week notified triage  RN of this. We laid pt flat , pt improved with this.

## 2017-04-24 NOTE — ED Notes (Signed)
EDP into room to update on results, medications and D/C plan.

## 2017-05-01 ENCOUNTER — Encounter (HOSPITAL_BASED_OUTPATIENT_CLINIC_OR_DEPARTMENT_OTHER): Payer: Self-pay | Admitting: *Deleted

## 2017-05-01 ENCOUNTER — Other Ambulatory Visit: Payer: Self-pay

## 2017-05-01 ENCOUNTER — Emergency Department (HOSPITAL_BASED_OUTPATIENT_CLINIC_OR_DEPARTMENT_OTHER): Payer: Self-pay

## 2017-05-01 ENCOUNTER — Emergency Department (HOSPITAL_BASED_OUTPATIENT_CLINIC_OR_DEPARTMENT_OTHER)
Admission: EM | Admit: 2017-05-01 | Discharge: 2017-05-02 | Disposition: A | Discharge status: Home / Self Care | Payer: Self-pay | Attending: Emergency Medicine | Admitting: Emergency Medicine

## 2017-05-01 DIAGNOSIS — I10 Essential (primary) hypertension: Secondary | ICD-10-CM | POA: Insufficient documentation

## 2017-05-01 DIAGNOSIS — Z79899 Other long term (current) drug therapy: Secondary | ICD-10-CM | POA: Insufficient documentation

## 2017-05-01 DIAGNOSIS — E860 Dehydration: Secondary | ICD-10-CM | POA: Insufficient documentation

## 2017-05-01 DIAGNOSIS — Z87891 Personal history of nicotine dependence: Secondary | ICD-10-CM | POA: Insufficient documentation

## 2017-05-01 DIAGNOSIS — A084 Viral intestinal infection, unspecified: Secondary | ICD-10-CM | POA: Insufficient documentation

## 2017-05-01 LAB — URINALYSIS, MICROSCOPIC (REFLEX): BACTERIA UA: NONE SEEN

## 2017-05-01 LAB — COMPREHENSIVE METABOLIC PANEL
ALBUMIN: 4.4 g/dL (ref 3.5–5.0)
ALK PHOS: 83 U/L (ref 38–126)
ALT: 119 U/L — ABNORMAL HIGH (ref 17–63)
AST: 97 U/L — AB (ref 15–41)
Anion gap: 11 (ref 5–15)
BILIRUBIN TOTAL: 1.2 mg/dL (ref 0.3–1.2)
BUN: 9 mg/dL (ref 6–20)
CALCIUM: 9.6 mg/dL (ref 8.9–10.3)
CO2: 34 mmol/L — ABNORMAL HIGH (ref 22–32)
CREATININE: 1.08 mg/dL (ref 0.61–1.24)
Chloride: 92 mmol/L — ABNORMAL LOW (ref 101–111)
GFR calc Af Amer: >60 mL/min (ref >60)
GLUCOSE: 109 mg/dL — AB (ref 65–99)
Potassium: 3.4 mmol/L — ABNORMAL LOW (ref 3.5–5.1)
Sodium: 137 mmol/L (ref 135–145)
TOTAL PROTEIN: 8.5 g/dL — AB (ref 6.5–8.1)

## 2017-05-01 LAB — CBC WITH DIFFERENTIAL/PLATELET
BASOS ABS: 0 10*3/uL (ref 0.0–0.1)
Basophils Relative: 0 %
EOS PCT: 0 %
Eosinophils Absolute: 0 10*3/uL (ref 0.0–0.7)
HEMATOCRIT: 46.7 % (ref 39.0–52.0)
HEMOGLOBIN: 16.1 g/dL (ref 13.0–17.0)
LYMPHS ABS: 1.3 10*3/uL (ref 0.7–4.0)
LYMPHS PCT: 13 %
MCH: 30.8 pg (ref 26.0–34.0)
MCHC: 34.5 g/dL (ref 30.0–36.0)
MCV: 89.3 fL (ref 78.0–100.0)
MONOS PCT: 9 %
Monocytes Absolute: 0.9 10*3/uL (ref 0.1–1.0)
NEUTROS ABS: 7.9 10*3/uL — AB (ref 1.7–7.7)
Neutrophils Relative %: 78 %
Platelets: 236 10*3/uL (ref 150–400)
RBC: 5.23 MIL/uL (ref 4.22–5.81)
RDW: 12.7 % (ref 11.5–15.5)
WBC: 10.1 10*3/uL (ref 4.0–10.5)

## 2017-05-01 LAB — URINALYSIS, ROUTINE W REFLEX MICROSCOPIC
GLUCOSE, UA: NEGATIVE mg/dL
HGB URINE DIPSTICK: NEGATIVE
KETONES UR: 15 mg/dL — AB
Leukocytes, UA: NEGATIVE
Nitrite: NEGATIVE
PH: 6.5 (ref 5.0–8.0)
PROTEIN: 100 mg/dL — AB
Specific Gravity, Urine: 1.025 (ref 1.005–1.030)

## 2017-05-01 LAB — LIPASE, BLOOD: LIPASE: 32 U/L (ref 11–51)

## 2017-05-01 MED ORDER — PROMETHAZINE HCL 25 MG/ML IJ SOLN
12.5000 mg | Freq: Once | INTRAMUSCULAR | Status: AC
  Administered 2017-05-01: 12.5 mg via INTRAVENOUS
  Filled 2017-05-01: qty 1

## 2017-05-01 MED ORDER — LACTATED RINGERS IV BOLUS (SEPSIS)
2000.0000 mL | Freq: Once | INTRAVENOUS | Status: AC
  Administered 2017-05-01: 2000 mL via INTRAVENOUS

## 2017-05-01 NOTE — ED Notes (Signed)
ED Provider at bedside. 

## 2017-05-01 NOTE — ED Notes (Signed)
Patient transported to X-ray 

## 2017-05-01 NOTE — ED Notes (Signed)
Pt reports "I just can't get over this cold". PT states he has been vomiting since last Monday and now has a headache.

## 2017-05-01 NOTE — ED Triage Notes (Signed)
Vomiting for a week. He is seeing some bright red streaks of blood in his vomitus.

## 2017-05-01 NOTE — ED Provider Notes (Addendum)
[facility] Provider Note: Lowella DellJ. Lane Kavian Peters, MD, FACEP  CSN: [id number] MRN: [id number] ARRIVAL: 05/01/17 at 1808 ROOM: [facility]   CHIEF COMPLAINT  Vomiting   HISTORY OF PRESENT ILLNESS  05/01/17 10:49 PM Orlan Leavensnthony T Houchen is a 36 y.o. male with about a 1 week history of nausea and vomiting.  He initially had diarrhea but that has resolved.  The vomiting has been persistent and he has not been able to keep any food on his stomach.  He has been able to drink Gatorade and eat ice.  He was seen on Christmas Eve and given IV fluids and multiple medications.  He was discharged on Bentyl, Robaxin and Zofran.  He states these medications have not helped.  He continues to vomit.  He has had associated subjective fever earlier in the week but none recently.  He has had profuse sweating at times.  He has some mild abdominal "soreness" but no frank pain.  He feels weak and fatigued.  He was given 3 L of normal saline by protocol prior to my evaluation with some improvement.   Past Medical History:  Diagnosis Date  . Hernia   . Hypertension     Past Surgical History:  Procedure Laterality Date  . INGUINAL HERNIA REPAIR Right 05/22/2013   Procedure: HERNIA REPAIR INGUINAL INCARCERATED WITH MESH;  Surgeon: Shelly Rubensteinouglas A Blackman, MD;  Location: [facility];  Service: General;  Laterality: Right;  . IRRIGATION AND DEBRIDEMENT ABSCESS  05/22/2013   Procedure: IRRIGATION AND DEBRIDEMENT  BUTTOCK ABSCESS;  Surgeon: Shelly Rubensteinouglas A Blackman, MD;  Location: [facility];  Service: General;;    Family History  Problem Relation Age of Onset  . Hypertension Mother     Social History   Tobacco Use  . Smoking status: Former Smoker    Packs/day: 0.25    Years: 10.00    Pack years: 2.50    Types: Cigarettes  . Smokeless tobacco: Never Used  Substance Use Topics  . Alcohol use: Yes    Alcohol/week: 0.6 oz    Types: 1 Cans of beer per week  . Drug use: Yes    Types: Marijuana    Prior to Admission  medications   Medication Sig Start Date End Date Taking? Authorizing Provider  dicyclomine (BENTYL) 20 MG tablet Take 1 tablet (20 mg total) by mouth 2 (two) times daily. 04/24/17   Long, Arlyss RepressJoshua G, MD  ibuprofen (ADVIL,MOTRIN) 800 MG tablet Take 1 tablet (800 mg total) by mouth every 8 (eight) hours as needed. 04/24/17   Long, Arlyss RepressJoshua G, MD  loratadine (CLARITIN) 10 MG tablet Take 10 mg by mouth daily as needed for allergies or rhinitis.     [provider]  methocarbamol (ROBAXIN) 500 MG tablet Take 1 tablet (500 mg total) by mouth 2 (two) times daily. 04/24/17   Long, Arlyss RepressJoshua G, MD  naproxen sodium (ANAPROX) 220 MG tablet Take 220 mg by mouth daily as needed (pain).    [provider]  ondansetron (ZOFRAN ODT) 4 MG disintegrating tablet Take 1 tablet (4 mg total) by mouth every 8 (eight) hours as needed for nausea or vomiting. 04/24/17   Long, Arlyss RepressJoshua G, MD  ondansetron (ZOFRAN) 4 MG tablet Take 1 tablet (4 mg total) by mouth every 6 (six) hours. 01/24/17   Marlon PelGreene, Tiffany, PA-C    Allergies Patient has no known allergies.   REVIEW OF SYSTEMS  Negative except as noted here or in the History of Present Illness.   PHYSICAL EXAMINATION  Initial Vital Signs Blood pressure 140/86, pulse 87, temperature 98.9 F (37.2 C), temperature source Oral, resp. rate 18, height 6\' 1"  (1.854 m), weight 81.6 kg (180 lb), SpO2 100 %.  Examination General: Well-developed, well-nourished male in no acute distress; appearance consistent with age of record HENT: normocephalic; atraumatic Eyes: pupils equal, round and reactive to light; extraocular muscles intact Neck: supple Heart: regular rate and rhythm Lungs: clear to auscultation bilaterally Abdomen: soft; nondistended; minimal diffuse tenderness; no masses or hepatosplenomegaly; bowel sounds hyperactive Extremities: No deformity; full range of motion; pulses normal Neurologic: Awake, alert and oriented; motor function intact in all  extremities and symmetric; no facial droop Skin: Warm and dry Psychiatric: Normal mood and affect   RESULTS  Summary of this visit's results, reviewed by myself:   EKG Interpretation  Date/Time:    Ventricular Rate:    PR Interval:    QRS Duration:   QT Interval:    QTC Calculation:   R Axis:     Text Interpretation:        Laboratory Studies: Results for orders placed or performed during the hospital encounter of 05/01/17 (from the past 24 hour(s))  CBC with Differential     Status: Abnormal   Collection Time: 05/01/17  7:49 PM  Result Value Ref Range   WBC 10.1 4.0 - 10.5 K/uL   RBC 5.23 4.22 - 5.81 MIL/uL   Hemoglobin 16.1 13.0 - 17.0 g/dL   HCT 16.1 09.6 - 04.5 %   MCV 89.3 78.0 - 100.0 fL   MCH 30.8 26.0 - 34.0 pg   MCHC 34.5 30.0 - 36.0 g/dL   RDW 40.9 81.1 - 91.4 %   Platelets 236 150 - 400 K/uL   Neutrophils Relative % 78 %   Lymphocytes Relative 13 %   Monocytes Relative 9 %   Eosinophils Relative 0 %   Basophils Relative 0 %   Neutro Abs 7.9 (H) 1.7 - 7.7 K/uL   Lymphs Abs 1.3 0.7 - 4.0 K/uL   Monocytes Absolute 0.9 0.1 - 1.0 K/uL   Eosinophils Absolute 0.0 0.0 - 0.7 K/uL   Basophils Absolute 0.0 0.0 - 0.1 K/uL   Smear Review MORPHOLOGY UNREMARKABLE   Comprehensive metabolic panel     Status: Abnormal   Collection Time: 05/01/17  7:49 PM  Result Value Ref Range   Sodium 137 135 - 145 mmol/L   Potassium 3.4 (L) 3.5 - 5.1 mmol/L   Chloride 92 (L) 101 - 111 mmol/L   CO2 34 (H) 22 - 32 mmol/L   Glucose, Bld 109 (H) 65 - 99 mg/dL   BUN 9 6 - 20 mg/dL   Creatinine, Ser 7.82 0.61 - 1.24 mg/dL   Calcium 9.6 8.9 - 95.6 mg/dL   Total Protein 8.5 (H) 6.5 - 8.1 g/dL   Albumin 4.4 3.5 - 5.0 g/dL   AST 97 (H) 15 - 41 U/L   ALT 119 (H) 17 - 63 U/L   Alkaline Phosphatase 83 38 - 126 U/L   Total Bilirubin 1.2 0.3 - 1.2 mg/dL   GFR calc non Af Amer >60 >60 mL/min   GFR calc Af Amer >60 >60 mL/min   Anion gap 11 5 - 15  Lipase, blood     Status: None    Collection Time: 05/01/17  7:49 PM  Result Value Ref Range   Lipase 32 11 - 51 U/L  Urinalysis, Routine w reflex microscopic     Status: Abnormal   Collection Time:  05/01/17 11:05 PM  Result Value Ref Range   Color, Urine AMBER (A) YELLOW   APPearance HAZY (A) CLEAR   Specific Gravity, Urine 1.025 1.005 - 1.030   pH 6.5 5.0 - 8.0   Glucose, UA NEGATIVE NEGATIVE mg/dL   Hgb urine dipstick NEGATIVE NEGATIVE   Bilirubin Urine SMALL (A) NEGATIVE   Ketones, ur 15 (A) NEGATIVE mg/dL   Protein, ur 409100 (A) NEGATIVE mg/dL   Nitrite NEGATIVE NEGATIVE   Leukocytes, UA NEGATIVE NEGATIVE  Urinalysis, Microscopic (reflex)     Status: Abnormal   Collection Time: 05/01/17 11:05 PM  Result Value Ref Range   RBC / HPF 0-5 0 - 5 RBC/hpf   WBC, UA 0-5 0 - 5 WBC/hpf   Bacteria, UA NONE SEEN NONE SEEN   Squamous Epithelial / LPF 0-5 (A) NONE SEEN   Mucus PRESENT    Hyaline Casts, UA PRESENT    Granular Casts, UA PRESENT    Imaging Studies: Dg Chest 2 View  Result Date: 05/01/2017 CLINICAL DATA:  Cough for the past week. EXAM: CHEST  2 VIEW COMPARISON:  Chest x-ray dated July 15, 2016. FINDINGS: The cardiomediastinal silhouette is normal in size. Normal pulmonary vascularity. New patchy opacity in the right upper lobe. No pleural effusion or pneumothorax. No acute osseous abnormality. IMPRESSION: Right upper lobe pneumonia. Electronically Signed   By: Obie DredgeWilliam T Derry M.D.   On: 05/01/2017 20:05    ED COURSE  Nursing notes and initial vitals signs, including pulse oximetry, reviewed.  Vitals:   05/01/17 1819 05/01/17 1822 05/01/17 1952 05/01/17 2236  BP:  (!) 142/84 130/90 140/86  Pulse:  86 92 87  Resp:  16 18 18   Temp:  98.5 F (36.9 C) 98.9 F (37.2 C)   TempSrc:   Oral   SpO2:  100% 100% 100%  Weight: 81.6 kg (180 lb)     Height: 6\' 1"  (1.854 m)      12:55 AM Feeling better after multiple IV fluid boluses and IV Phenergan.  He has been ambulatory without feeling lightheaded.  He has  been drinking fluids without emesis.  Will place on doxycycline for possible pneumonia.  PROCEDURES    ED DIAGNOSES     ICD-10-CM   1. Viral gastroenteritis A08.4   2. Dehydration E86.0        Sophronia Varney, Jonny RuizJohn, MD 05/02/17 [id number]    Paula LibraMolpus, Jaleal Schliep, MD 05/02/17 [id number]

## 2017-05-02 MED ORDER — DOXYCYCLINE HYCLATE 100 MG PO TABS
100.0000 mg | ORAL_TABLET | Freq: Once | ORAL | Status: AC
  Administered 2017-05-02: 100 mg via ORAL
  Filled 2017-05-02: qty 1

## 2017-05-02 MED ORDER — PROMETHAZINE HCL 25 MG PO TABS: 25.0000 mg | ORAL_TABLET | Freq: Four times a day (QID) | ORAL | 0 refills | Status: DC | PRN

## 2017-05-02 MED ORDER — DOXYCYCLINE HYCLATE 100 MG PO CAPS: 100.0000 mg | ORAL_CAPSULE | Freq: Two times a day (BID) | ORAL | 0 refills | Status: DC

## 2017-05-02 NOTE — ED Notes (Signed)
PT ambulated without difficulty.

## 2017-05-02 NOTE — ED Notes (Signed)
PT discharged to home with family. NAD. 

## 2019-05-01 ENCOUNTER — Ambulatory Visit (HOSPITAL_COMMUNITY)
Admission: EM | Admit: 2019-05-01 | Discharge: 2019-05-01 | Disposition: A | Discharge status: Home / Self Care | Payer: Self-pay | Attending: Family Medicine | Admitting: Family Medicine

## 2019-05-01 ENCOUNTER — Other Ambulatory Visit: Payer: Self-pay

## 2019-05-01 ENCOUNTER — Encounter (HOSPITAL_COMMUNITY): Payer: Self-pay

## 2019-05-01 DIAGNOSIS — S39012A Strain of muscle, fascia and tendon of lower back, initial encounter: Secondary | ICD-10-CM

## 2019-05-01 MED ORDER — IBUPROFEN 800 MG PO TABS: 800.0000 mg | ORAL_TABLET | Freq: Three times a day (TID) | ORAL | 0 refills | Status: DC

## 2019-05-01 MED ORDER — CYCLOBENZAPRINE HCL 10 MG PO TABS: 10.0000 mg | ORAL_TABLET | Freq: Two times a day (BID) | ORAL | 0 refills | Status: AC | PRN

## 2019-05-01 NOTE — Discharge Instructions (Signed)
Take the medication as prescribed Heat/ice Stretching and good body mechanics.  Follow up as needed for continued or worsening symptoms

## 2019-05-01 NOTE — ED Triage Notes (Signed)
Pt presents with back pain that he believes to be associated with some lifting that he did before christmas.

## 2019-05-05 NOTE — ED Provider Notes (Signed)
[facility]    CSN: [id number] Arrival date & time: 05/01/19  0258      History   Chief Complaint Chief Complaint  Patient presents with  . Back Pain    HPI Connor Holloway is a 39 y.o. male.   Patient is a 39 year old male who presents today with lower back pain.  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  Symptoms have been constant.  He has been taking ibuprofen with some relief.   Reports the sensation as tightness in his back.  Denies any associated numbness, tingling or radiation of pain.  Denies any weakness in lower extremities, saddle paresthesias or loss of bowel or bladder control.  ROS per HPI      Past Medical History:  Diagnosis Date  . Hernia   . Hypertension     Patient Active Problem List   Diagnosis Date Noted  . Perirectal abscess 05/22/2013  . Right inguinal hernia 05/21/2013  . Inguinal hernia 10/29/2010    Past Surgical History:  Procedure Laterality Date  . INGUINAL HERNIA REPAIR Right 05/22/2013   Procedure: HERNIA REPAIR INGUINAL INCARCERATED WITH MESH;  Surgeon: Shelly Rubenstein, MD;  Location: [facility] OR;  Service: General;  Laterality: Right;  . IRRIGATION AND DEBRIDEMENT ABSCESS  05/22/2013   Procedure: IRRIGATION AND DEBRIDEMENT  BUTTOCK ABSCESS;  Surgeon: Shelly Rubenstein, MD;  Location: [facility];  Service: General;;       Home Medications    Prior to Admission medications   Medication Sig Start Date End Date Taking? Authorizing Provider  cyclobenzaprine (FLEXERIL) 10 MG tablet Take 1 tablet (10 mg total) by mouth 2 (two) times daily as needed for muscle spasms. 05/01/19   Dahlia Byes A, NP  dicyclomine (BENTYL) 20 MG tablet Take 1 tablet (20 mg total) by mouth 2 (two) times daily. 04/24/17   Long, Arlyss Repress, MD  ibuprofen (ADVIL) 800 MG tablet Take 1 tablet (800 mg total) by mouth 3 (three) times daily. 05/01/19   Dahlia Byes A, NP  loratadine (CLARITIN) 10 MG tablet Take 10 mg by mouth  daily as needed for allergies or rhinitis.     [provider]  promethazine (PHENERGAN) 25 MG tablet Take 1 tablet (25 mg total) by mouth every 6 (six) hours as needed for nausea or vomiting. 05/02/17   Molpus, John, MD    Family History Family History  Problem Relation Age of Onset  . Hypertension Mother     Social History Social History   Tobacco Use  . Smoking status: Former Smoker    Packs/day: 0.25    Years: 10.00    Pack years: 2.50    Types: Cigarettes  . Smokeless tobacco: Never Used  Substance Use Topics  . Alcohol use: Yes    Alcohol/week: 1.0 standard drinks    Types: 1 Cans of beer per week  . Drug use: Yes    Types: Marijuana     Allergies   Patient has no known allergies.   Review of Systems Review of Systems   Physical Exam Triage Vital Signs ED Triage Vitals  Enc Vitals Group     BP 05/01/19 0917 (!) 155/90     Pulse Rate 05/01/19 0917 (!) 105     Resp 05/01/19 0917 18     Temp 05/01/19 0917 98.1 F (36.7 C)     Temp Source 05/01/19 0917 Oral     SpO2 05/01/19 0917 100 %  Weight --      Height --      Head Circumference --      Peak Flow --      Pain Score 05/01/19 0916 9     Pain Loc --      Pain Edu? --      Excl. in [place]? --    No data found.  Updated Vital Signs BP (!) 155/90 (BP Location: Right Arm)   Pulse 65   Temp 98.1 F (36.7 C) (Oral)   Resp 18   SpO2 100%   Visual Acuity Right Eye Distance:   Left Eye Distance:   Bilateral Distance:    Right Eye Near:   Left Eye Near:    Bilateral Near:     Physical Exam Vitals and nursing note reviewed.  Constitutional:      Appearance: Normal appearance.  HENT:     Head: Normocephalic and atraumatic.  Pulmonary:     Effort: Pulmonary effort is normal.  Musculoskeletal:        General: Tenderness present.     Cervical back: Normal range of motion.     Lumbar back: Tenderness present. No swelling, edema, deformity, signs of trauma, lacerations, spasms or bony  tenderness. Decreased range of motion. Negative right straight leg raise test and negative left straight leg raise test. No scoliosis.       Back:  Skin:    General: Skin is warm and dry.  Neurological:     Mental Status: He is alert.  Psychiatric:        Mood and Affect: Mood normal.      UC Treatments / Results  Labs (all labs ordered are listed, but only abnormal results are displayed) Labs Reviewed - No data to display  EKG   Radiology No results found.  Procedures Procedures (including critical care time)  Medications Ordered in UC Medications - No data to display  Initial Impression / Assessment and Plan / UC Course  I have reviewed the triage vital signs and the nursing notes.  Pertinent labs & imaging results that were available during my care of the patient were reviewed by me and considered in my medical decision making (see chart for details).     Lower lumbar strain Treating with ibuprofen and Flexeril Rest, heat/ice and gentle stretching Follow up as needed for continued or worsening symptoms  Final Clinical Impressions(s) / UC Diagnoses   Final diagnoses:  Strain of lumbar region, initial encounter     Discharge Instructions     Take the medication as prescribed Heat/ice Stretching and good body mechanics.  Follow up as needed for continued or worsening symptoms     ED Prescriptions    Medication Sig Dispense Auth. Provider   cyclobenzaprine (FLEXERIL) 10 MG tablet Take 1 tablet (10 mg total) by mouth 2 (two) times daily as needed for muscle spasms. 20 tablet Andrae Claunch A, NP   ibuprofen (ADVIL) 800 MG tablet Take 1 tablet (800 mg total) by mouth 3 (three) times daily. 21 tablet Matylda Fehring A, NP     PDMP not reviewed this encounter.   Orvan July, NP 05/05/19 1514

## 2019-07-04 ENCOUNTER — Encounter (HOSPITAL_COMMUNITY): Payer: Self-pay | Admitting: Emergency Medicine

## 2019-07-04 ENCOUNTER — Other Ambulatory Visit: Payer: Self-pay

## 2019-07-04 ENCOUNTER — Emergency Department (HOSPITAL_COMMUNITY)
Admission: EM | Admit: 2019-07-04 | Discharge: 2019-07-05 | Disposition: A | Discharge status: Home / Self Care | Payer: Self-pay | Attending: Emergency Medicine | Admitting: Emergency Medicine

## 2019-07-04 DIAGNOSIS — R61 Generalized hyperhidrosis: Secondary | ICD-10-CM | POA: Insufficient documentation

## 2019-07-04 DIAGNOSIS — I159 Secondary hypertension, unspecified: Secondary | ICD-10-CM | POA: Insufficient documentation

## 2019-07-04 DIAGNOSIS — I158 Other secondary hypertension: Secondary | ICD-10-CM

## 2019-07-04 NOTE — ED Triage Notes (Signed)
Patient reports "sweaty hands" with decreased appetite x2 days. Reports frequent headaches. Denies chest pain and SOB. Hypertensive in triage.

## 2019-07-05 NOTE — ED Provider Notes (Signed)
[facility]PT Provider Note  CSN: [id number] Arrival date & time: 07/04/19 2058  Chief Complaint(s) Hypertension  HPI Connor Holloway is a 39 y.o. male who presents to the emergency department with elevated blood pressures.  Patient reports that he has been having sweaty hands and chest.  Patient is also felt tired for several days.  States that he initially thought this might be related to working outside. He had his father, who is a [employer] fundraiser, check his BP and it was elevated. He denies chest pain or SOB. No current headache. No recent infections. No daily use of OTC medication. Admits to intermittent THC and EtOH use. No other illicit drug use. No other physical complaints.  HPI  Past Medical History Past Medical History:  Diagnosis Date  . Hernia   . Hypertension    Patient Active Problem List   Diagnosis Date Noted  . Perirectal abscess 05/22/2013  . Right inguinal hernia 05/21/2013  . Inguinal hernia 10/29/2010   Home Medication(s) Prior to Admission medications   Medication Sig Start Date End Date Taking? Authorizing Provider  cyclobenzaprine (FLEXERIL) 10 MG tablet Take 1 tablet (10 mg total) by mouth 2 (two) times daily as needed for muscle spasms. 05/01/19   Dahlia Byes A, NP  dicyclomine (BENTYL) 20 MG tablet Take 1 tablet (20 mg total) by mouth 2 (two) times daily. 04/24/17   Long, Arlyss Repress, MD  ibuprofen (ADVIL) 800 MG tablet Take 1 tablet (800 mg total) by mouth 3 (three) times daily. 05/01/19   Dahlia Byes A, NP  loratadine (CLARITIN) 10 MG tablet Take 10 mg by mouth daily as needed for allergies or rhinitis.     [provider]  promethazine (PHENERGAN) 25 MG tablet Take 1 tablet (25 mg total) by mouth every 6 (six) hours as needed for nausea or vomiting. 05/02/17   Molpus, John, MD                                                                                                                                    Past Surgical  History Past Surgical History:  Procedure Laterality Date  . INGUINAL HERNIA REPAIR Right 05/22/2013   Procedure: HERNIA REPAIR INGUINAL INCARCERATED WITH MESH;  Surgeon: Shelly Rubenstein, MD;  Location: [facility];  Service: General;  Laterality: Right;  . IRRIGATION AND DEBRIDEMENT ABSCESS  05/22/2013   Procedure: IRRIGATION AND DEBRIDEMENT  BUTTOCK ABSCESS;  Surgeon: Shelly Rubenstein, MD;  Location: [facility];  Service: General;;   Family History Family History  Problem Relation Age of Onset  . Hypertension Mother     Social History Social History   Tobacco Use  . Smoking status: Former Smoker    Packs/day: 0.25    Years: 10.00    Pack years: 2.50    Types: Cigarettes  . Smokeless tobacco: Never Used  Substance Use Topics  . Alcohol use: Yes  Alcohol/week: 1.0 standard drinks    Types: 1 Cans of beer per week  . Drug use: Yes    Types: Marijuana   Allergies Patient has no known allergies.  Review of Systems Review of Systems All other systems are reviewed and are negative for acute change except as noted in the HPI  Physical Exam Vital Signs  I have reviewed the triage vital signs BP (!) 152/111 (BP Location: Left Arm)   Pulse 62   Temp 98.4 F (36.9 C) (Oral)   Resp 15   Ht 6' (1.829 m)   Wt 77.1 kg   SpO2 100%   BMI 23.06 kg/m   Physical Exam Vitals reviewed.  Constitutional:      General: He is not in acute distress.    Appearance: He is well-developed. He is not diaphoretic.  HENT:     Head: Normocephalic and atraumatic.     Nose: Nose normal.  Eyes:     General: No scleral icterus.       Right eye: No discharge.        Left eye: No discharge.     Conjunctiva/sclera: Conjunctivae normal.     Pupils: Pupils are equal, round, and reactive to light.  Cardiovascular:     Rate and Rhythm: Normal rate and regular rhythm.     Heart sounds: No murmur. No friction rub. No gallop.   Pulmonary:     Effort: Pulmonary effort is normal. No respiratory  distress.     Breath sounds: Normal breath sounds. No stridor. No rales.  Abdominal:     General: There is no distension.     Palpations: Abdomen is soft.     Tenderness: There is no abdominal tenderness.  Musculoskeletal:        General: No tenderness.     Cervical back: Normal range of motion and neck supple.  Skin:    General: Skin is warm and dry.     Findings: No erythema or rash.  Neurological:     Mental Status: He is alert and oriented to person, place, and time.     ED Results and Treatments Labs (all labs ordered are listed, but only abnormal results are displayed) Labs Reviewed - No data to display                                                                                                                       EKG  EKG Interpretation  Date/Time:    Ventricular Rate:    PR Interval:    QRS Duration:   QT Interval:    QTC Calculation:   R Axis:     Text Interpretation:        Radiology No results found.  Pertinent labs & imaging results that were available during my care of the patient were reviewed by me and considered in my medical decision making (see chart for details).  Medications Ordered in ED Medications - No data to display  Procedures Procedures  (including critical care time)  Medical Decision Making / ED Course I have reviewed the nursing notes for this encounter and the patient's prior records (if available in EHR or on provided paperwork).   Connor Holloway was evaluated in Emergency Department on 07/05/2019 for the symptoms described in the history of present illness. He was evaluated in the context of the global COVID-19 pandemic, which necessitated consideration that the patient might be at risk for infection with the SARS-CoV-2 virus that causes COVID-19. Institutional protocols and algorithms that pertain to  the evaluation of patients at risk for COVID-19 are in a state of rapid change based on information released by regulatory bodies including the CDC and federal and state organizations. These policies and algorithms were followed during the patient's care in the ED.  Asymptomatic HTN. Initially sBP in 180s. Resolved to 150s w/o intervention. No need for intervention or work up at this time. Recommended low salt diet, tracking daily BPs, and establishing care with PCP.      Final Clinical Impression(s) / ED Diagnoses Final diagnoses:  Other secondary hypertension    The patient appears reasonably screened and/or stabilized for discharge and I doubt any other medical condition or other [facility] requiring further screening, evaluation, or treatment in the ED at this time prior to discharge. Safe for discharge with strict return precautions.  Disposition: Discharge  Condition: Good  I have discussed the results, Dx and Tx plan with the patient/family who expressed understanding and agree(s) with the plan. Discharge instructions discussed at length. The patient/family was given strict return precautions who verbalized understanding of the instructions. No further questions at time of discharge.    ED Discharge Orders    None       Follow Up: Primary care provider  Schedule an appointment as soon as possible for a visit  If you do not have a primary care physician, contact [facility] at [phone] for referral     This chart was dictated using voice recognition software.  Despite best efforts to proofread,  errors can occur which can change the documentation meaning.   Fatima Blank, MD 07/05/19 [phone]

## 2021-08-11 ENCOUNTER — Ambulatory Visit (HOSPITAL_COMMUNITY)
Admission: EM | Admit: 2021-08-11 | Discharge: 2021-08-11 | Disposition: A | Discharge status: Home / Self Care | Payer: Self-pay | Attending: Internal Medicine | Admitting: Internal Medicine

## 2021-08-11 ENCOUNTER — Ambulatory Visit (INDEPENDENT_AMBULATORY_CARE_PROVIDER_SITE_OTHER): Payer: Self-pay

## 2021-08-11 ENCOUNTER — Encounter (HOSPITAL_COMMUNITY): Payer: Self-pay | Admitting: Emergency Medicine

## 2021-08-11 DIAGNOSIS — S161XXA Strain of muscle, fascia and tendon at neck level, initial encounter: Secondary | ICD-10-CM

## 2021-08-11 MED ORDER — MELOXICAM 15 MG PO TABS: 15.0000 mg | ORAL_TABLET | Freq: Every day | ORAL | 0 refills | Status: AC

## 2021-08-11 MED ORDER — BACLOFEN 10 MG PO TABS: 10.0000 mg | ORAL_TABLET | Freq: Every evening | ORAL | 0 refills | Status: AC | PRN
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
## 2021-09-09 ENCOUNTER — Other Ambulatory Visit: Payer: Self-pay | Admitting: Family Medicine

## 2022-10-07 ENCOUNTER — Other Ambulatory Visit: Payer: Self-pay

## 2022-10-07 ENCOUNTER — Emergency Department (HOSPITAL_COMMUNITY)
Admission: EM | Admit: 2022-10-07 | Discharge: 2022-10-07 | Disposition: A | Discharge status: Home / Self Care | Payer: 59 | Attending: Emergency Medicine | Admitting: Emergency Medicine

## 2022-10-07 DIAGNOSIS — Y9389 Activity, other specified: Secondary | ICD-10-CM | POA: Diagnosis not present

## 2022-10-07 DIAGNOSIS — Z789 Other specified health status: Secondary | ICD-10-CM

## 2022-10-07 DIAGNOSIS — W271XXA Contact with garden tool, initial encounter: Secondary | ICD-10-CM | POA: Insufficient documentation

## 2022-10-07 DIAGNOSIS — Z23 Encounter for immunization: Secondary | ICD-10-CM | POA: Insufficient documentation

## 2022-10-07 DIAGNOSIS — S51811A Laceration without foreign body of right forearm, initial encounter: Secondary | ICD-10-CM | POA: Diagnosis present

## 2022-10-07 MED ORDER — TETANUS-DIPHTH-ACELL PERTUSSIS 5-2.5-18.5 LF-MCG/0.5 IM SUSY
0.5000 mL | PREFILLED_SYRINGE | Freq: Once | INTRAMUSCULAR | Status: AC
  Administered 2022-10-07: 0.5 mL via INTRAMUSCULAR
  Filled 2022-10-07: qty 0.5

## 2022-10-07 MED ORDER — LIDOCAINE-EPINEPHRINE (PF) 2 %-1:200000 IJ SOLN
10.0000 mL | Freq: Once | INTRAMUSCULAR | Status: AC
  Administered 2022-10-07: 10 mL via INTRADERMAL
  Filled 2022-10-07: qty 20

## 2022-10-07 NOTE — Discharge Instructions (Addendum)
Keep the laceration clean and dry. You may use warm soap and water to gently clean it.   You must follow up with your primary care provider or an urgent care 7 days from now (10/14/22) to get your sutures removed. You must call to make this appointment with your preferred location. If you are unable to get into your PCP or urgent care, you may return to the ER to get your sutures removed.   If your laceration becomes red, swollen, has discharge, you develop a fever, or it otherwise looks infected, return to an urgent care or an ED.
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
## 2022-10-07 NOTE — ED Provider Notes (Addendum)
[place] EMERGENCY DEPARTMENT AT [facility] Provider Note   CSN: [id number] Arrival date & time: 10/07/22  1623     History  Chief Complaint  Patient presents with   Extremity Laceration    Connor Holloway is a 42 y.o. male presents after cutting his left forearm with a machete approximately 2 hours prior to presenting to the ED.  He says he was cutting the weeds in his lawn when it slipped and cut his arm.  Bleeding is mostly controlled.  He does not have an up-to-date tetanus.  HPI     Home Medications Prior to Admission medications   Medication Sig Start Date End Date Taking? Authorizing Provider  baclofen (LIORESAL) 10 MG tablet Take 1 tablet (10 mg total) by mouth at bedtime as needed for muscle spasms. 08/11/21   Meccariello, Solmon Ice, MD  cyclobenzaprine (FLEXERIL) 10 MG tablet Take 1 tablet (10 mg total) by mouth 2 (two) times daily as needed for muscle spasms. 05/01/19   Dahlia Byes A, NP  dicyclomine (BENTYL) 20 MG tablet Take 1 tablet (20 mg total) by mouth 2 (two) times daily. 04/24/17   Long, Arlyss Repress, MD  loratadine (CLARITIN) 10 MG tablet Take 10 mg by mouth daily as needed for allergies or rhinitis.     [provider]  meloxicam (MOBIC) 15 MG tablet Take 1 tablet (15 mg total) by mouth daily. 08/11/21   Meccariello, Solmon Ice, MD  promethazine (PHENERGAN) 25 MG tablet Take 1 tablet (25 mg total) by mouth every 6 (six) hours as needed for nausea or vomiting. 05/02/17   Molpus, Jonny Ruiz, MD      Allergies    Patient has no known allergies.    Review of Systems   Review of Systems  Constitutional:  Negative for chills and fever.    Physical Exam Updated Vital Signs BP (!) 146/82 (BP Location: Right Arm)   Pulse 65   Temp 99.2 F (37.3 C) (Oral)   Resp 18   Ht 6' (1.829 m)   Wt 74.8 kg   SpO2 100%   BMI 22.38 kg/m  Physical Exam Vitals and nursing note reviewed.  Constitutional:      General: He is not in acute distress. Pulmonary:      Effort: Pulmonary effort is normal.  Skin:    Capillary Refill: Capillary refill takes less than 2 seconds.          Comments: Approximately 3 cm long superficial laceration on the anterior aspect of the right forearm.  No obvious debris. Mild bleeding.  Neurological:     Mental Status: He is alert.     Sensory: Sensation is intact. No sensory deficit.     Comments: Sensation intact in the right upper extremity.  Radian, median, and ulnar nerve intact.     ED Results / Procedures / Treatments   Labs (all labs ordered are listed, but only abnormal results are displayed) Labs Reviewed - No data to display  EKG None  Radiology No results found.  Procedures .Marland KitchenLaceration Repair  Date/Time: 10/07/2022 9:15 PM  Performed by: Arabella Merles, PA-C Authorized by: Arabella Merles, PA-C   Consent:    Consent obtained:  Verbal   Consent given by:  Patient   Risks, benefits, and alternatives were discussed: yes     Risks discussed:  Infection, pain and poor cosmetic result   Alternatives discussed:  No treatment Universal protocol:    Procedure explained and questions answered to patient or  proxy's satisfaction: yes   Anesthesia:    Anesthesia method:  Local infiltration   Local anesthetic:  Lidocaine 2% WITH epi Laceration details:    Location: Right anterior forearm.   Length (cm):  3   Depth (mm):  2 Pre-procedure details:    Preparation:  Patient was prepped and draped in usual sterile fashion Treatment:    Area cleansed with:  Povidone-iodine   Amount of cleaning:  Standard   Irrigation solution:  Tap water   Irrigation method:  Tap   Debridement: None required. Skin repair:    Repair method:  Sutures   Suture size:  5-0   Suture material:  Prolene   Suture technique:  Simple interrupted   Number of sutures:  4 Repair type:    Repair type:  Simple Post-procedure details:    Dressing:  Sterile dressing and non-adherent dressing   Procedure completion:   Tolerated well, no immediate complications Comments:     Neurovascularly intact before and after procedure     Medications Ordered in ED Medications - No data to display  ED Course/ Medical Decision Making/ A&P                             Medical Decision Making Risk Prescription drug management.  42 y.o. male presents to the ED for concern of right forearm laceration  Differential diagnosis includes but is not limited to right forearm laceration  ED Course:  Patient verbally consented to laceration repair and tolerated repair well without immediate complication. See details of repair in procedure note.  Patient given tetanus shot   The patient was discharged home with instructions to visit his primary care provider or urgent care in 7 days to get the sutures removed.  He understands if his primary care provider or urgent care is not available he can also return to the ED to get sutures removed.  He understands the sutures should be left in place until removal by a medical professional. He understands to keep the area dry and clean.  He was given return precautions.    Impression: Right forearm laceration         Final Clinical Impression(s) / ED Diagnoses Final diagnoses:  None    Rx / DC Orders ED Discharge Orders     None         Arabella Merles, PA-C 10/07/22 2125    Arabella Merles, PA-C 10/07/22 2126    Arby Barrette, MD 10/07/22 2337

## 2023-08-05 ENCOUNTER — Emergency Department (HOSPITAL_COMMUNITY)
Admission: EM | Admit: 2023-08-05 | Discharge: 2023-08-05 | Disposition: A | Discharge status: Home / Self Care | Attending: Emergency Medicine | Admitting: Emergency Medicine

## 2023-08-05 ENCOUNTER — Encounter (HOSPITAL_COMMUNITY): Payer: Self-pay | Admitting: *Deleted

## 2023-08-05 ENCOUNTER — Other Ambulatory Visit: Payer: Self-pay

## 2023-08-05 DIAGNOSIS — S71111A Laceration without foreign body, right thigh, initial encounter: Secondary | ICD-10-CM | POA: Diagnosis not present

## 2023-08-05 DIAGNOSIS — W540XXA Bitten by dog, initial encounter: Secondary | ICD-10-CM | POA: Diagnosis not present

## 2023-08-05 DIAGNOSIS — I10 Essential (primary) hypertension: Secondary | ICD-10-CM | POA: Insufficient documentation

## 2023-08-05 DIAGNOSIS — S79921A Unspecified injury of right thigh, initial encounter: Secondary | ICD-10-CM | POA: Diagnosis present

## 2023-08-05 MED ORDER — AMOXICILLIN-POT CLAVULANATE 875-125 MG PO TABS
1.0000 | ORAL_TABLET | Freq: Once | ORAL | Status: AC
  Administered 2023-08-05: 1 via ORAL
  Filled 2023-08-05: qty 1

## 2023-08-05 MED ORDER — AMOXICILLIN-POT CLAVULANATE 875-125 MG PO TABS: 1.0000 | ORAL_TABLET | Freq: Two times a day (BID) | ORAL | 0 refills | Status: AC

## 2023-08-05 NOTE — ED Provider Notes (Signed)
 [place] EMERGENCY DEPARTMENT AT [facility] Provider Note   CSN: [id number] Arrival date & time: 08/05/23  1652     History  Chief Complaint  Patient presents with   Animal Bite    Connor Holloway is a 43 y.o. male with history of hypertension who presents with concern for a dog bite to the back of his right thigh that occurred just prior to arrival.  This dog was known to him and is up-to-date on vaccinations.  He also reports his last tetanus was within the last year.    Animal Bite      Home Medications Prior to Admission medications   Medication Sig Start Date End Date Taking? Authorizing Provider  amoxicillin-clavulanate (AUGMENTIN) 875-125 MG tablet Take 1 tablet by mouth every 12 (twelve) hours. 08/06/23  Yes Arabella Merles, PA-C  baclofen (LIORESAL) 10 MG tablet Take 1 tablet (10 mg total) by mouth at bedtime as needed for muscle spasms. 08/11/21   Meccariello, Solmon Ice, MD  cyclobenzaprine (FLEXERIL) 10 MG tablet Take 1 tablet (10 mg total) by mouth 2 (two) times daily as needed for muscle spasms. 05/01/19   Dahlia Byes A, FNP  dicyclomine (BENTYL) 20 MG tablet Take 1 tablet (20 mg total) by mouth 2 (two) times daily. 04/24/17   Long, Arlyss Repress, MD  loratadine (CLARITIN) 10 MG tablet Take 10 mg by mouth daily as needed for allergies or rhinitis.     [provider]  meloxicam (MOBIC) 15 MG tablet Take 1 tablet (15 mg total) by mouth daily. 08/11/21   Meccariello, Solmon Ice, MD      Allergies    Patient has no known allergies.    Review of Systems   Review of Systems  Skin:  Positive for wound.    Physical Exam Updated Vital Signs BP (!) 167/100 (BP Location: Left Arm)   Pulse 74   Temp (!) 97.4 F (36.3 C)   Resp 18   Ht 6' (1.829 m)   Wt 74.8 kg   SpO2 100%   BMI 22.37 kg/m  Physical Exam Vitals and nursing note reviewed.  Constitutional:      Appearance: Normal appearance.  HENT:     Head: Atraumatic.  Pulmonary:     Effort:  Pulmonary effort is normal.  Genitourinary:    Comments: RN Penni Bombard Sheets present to chaperone exam  Patient with 2 cm linear laceration to the posterior upper right thigh.  Bite approximately 3 mm deep.  No foreign bodies. No bone or muscle visualized. Bleeding well controlled Musculoskeletal:        General: Normal range of motion.     Comments: Able to ambulate without difficulty  Neurological:     General: No focal deficit present.     Mental Status: He is alert.     Comments: Intact sensation in the bilateral lower extremities  Psychiatric:        Mood and Affect: Mood normal.        Behavior: Behavior normal.      ED Results / Procedures / Treatments   Labs (all labs ordered are listed, but only abnormal results are displayed) Labs Reviewed - No data to display  EKG None  Radiology No results found.  Procedures Procedures    Medications Ordered in ED Medications  amoxicillin-clavulanate (AUGMENTIN) 875-125 MG per tablet 1 tablet (has no administration in time range)    ED Course/ Medical Decision Making/ A&P  Medical Decision Making Risk Prescription drug management.     Differential diagnosis includes but is not limited to Dog bite, cellulitis, muscle injury, nerve injury  ED Course:  Upon initial evaluation, patient is well-appearing, stable vital signs aside from elevated blood pressure 167/100.  He was bitten by a known dog earlier today.  Has 2 cm laceration to the back of his right thigh, pictured above. Neurovascularly intact. This area was irrigated with 1 L of sterile saline.  He was given first dose of Augmentin here today.  No indication for tetanus or rabies series at this time as his last tetanus was within the last year and dog was up to date on vaccines.  Medications Given: Augmentin   Impression: Dog bite  Disposition:  The patient was discharged home with instructions to take 7-day course of  Augmentin as prescribed.  Keep bite site clean with soap and water and allow to heal over on its own. Return precautions given.   This chart was dictated using voice recognition software, Dragon. Despite the best efforts of this provider to proofread and correct errors, errors may still occur which can change documentation meaning.          Final Clinical Impression(s) / ED Diagnoses Final diagnoses:  Dog bite, initial encounter    Rx / DC Orders ED Discharge Orders          Ordered    amoxicillin-clavulanate (AUGMENTIN) 875-125 MG tablet  Every 12 hours        08/05/23 1748              Arabella Merles, PA-C 08/05/23 1755    Melene Plan, DO 08/05/23 [phone]

## 2023-08-05 NOTE — ED Triage Notes (Signed)
 The pt is c/o a dog bite rt buttocks  1430 today the dog has had its shots and was a family dog

## 2023-08-05 NOTE — Discharge Instructions (Addendum)
 You have been prescribed Augmentin to prevent infection in your dog bite. Take this antibiotic 2 times a day for the next 7 days. Take the full course of your antibiotic even if you start feeling better. Antibiotics may cause you to have diarrhea.  You were given your first dose here.  You may take your next dose tomorrow morning.  Please keep the bite site clean with soap and water.  You may cover loosely with gauze as shown here.  Your blood pressure was elevated today. Please have this rechecked with your PCP within the next month.  Return to the ER if you have any pus coming out of the wound, red streaking, fevers, increased pain, any other new or concerning symptoms.
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
